# Patient Record
Sex: Female | Born: 1960 | Race: White | Hispanic: No | Marital: Single | Smoking: Former smoker
Health system: Southern US, Community
[De-identification: ages and names within clinical notes are randomized; demographics above are authoritative.]

## PROBLEM LIST (undated history)

## (undated) DIAGNOSIS — K5730 Diverticulosis of large intestine without perforation or abscess without bleeding: Secondary | ICD-10-CM

## (undated) HISTORY — PX: THYROIDECTOMY: SHX17

## (undated) HISTORY — PX: ABDOMINAL HYSTERECTOMY: SHX81

## (undated) HISTORY — DX: Diverticulosis of large intestine without perforation or abscess without bleeding: K57.30

---

## 2000-03-12 ENCOUNTER — Encounter: Payer: Self-pay | Admitting: Internal Medicine

## 2000-03-12 ENCOUNTER — Other Ambulatory Visit: Admission: RE | Admit: 2000-03-12 | Discharge: 2000-03-12 | Payer: Self-pay | Admitting: Family Medicine

## 2000-03-12 ENCOUNTER — Encounter: Admission: RE | Admit: 2000-03-12 | Discharge: 2000-03-12 | Payer: Self-pay | Admitting: Internal Medicine

## 2003-02-01 ENCOUNTER — Encounter (HOSPITAL_COMMUNITY): Admission: RE | Admit: 2003-02-01 | Discharge: 2003-05-02 | Payer: Self-pay | Admitting: Endocrinology

## 2003-02-02 ENCOUNTER — Encounter: Payer: Self-pay | Admitting: Endocrinology

## 2003-02-04 DIAGNOSIS — E0500 Thyrotoxicosis with diffuse goiter without thyrotoxic crisis or storm: Secondary | ICD-10-CM

## 2003-07-26 ENCOUNTER — Encounter (HOSPITAL_COMMUNITY): Admission: RE | Admit: 2003-07-26 | Discharge: 2003-10-24 | Payer: Self-pay | Admitting: Endocrinology

## 2004-07-06 ENCOUNTER — Ambulatory Visit: Payer: Self-pay | Admitting: Family Medicine

## 2004-08-15 ENCOUNTER — Ambulatory Visit: Payer: Self-pay | Admitting: Family Medicine

## 2004-08-17 ENCOUNTER — Ambulatory Visit: Payer: Self-pay | Admitting: Family Medicine

## 2004-08-24 ENCOUNTER — Ambulatory Visit: Payer: Self-pay | Admitting: Family Medicine

## 2004-08-24 ENCOUNTER — Encounter: Admission: RE | Admit: 2004-08-24 | Discharge: 2004-08-24 | Payer: Self-pay | Admitting: Family Medicine

## 2004-08-31 ENCOUNTER — Ambulatory Visit: Payer: Self-pay | Admitting: Internal Medicine

## 2004-09-01 ENCOUNTER — Ambulatory Visit: Payer: Self-pay | Admitting: Internal Medicine

## 2004-09-06 DIAGNOSIS — K5730 Diverticulosis of large intestine without perforation or abscess without bleeding: Secondary | ICD-10-CM

## 2004-09-06 DIAGNOSIS — K589 Irritable bowel syndrome without diarrhea: Secondary | ICD-10-CM

## 2004-09-06 DIAGNOSIS — K648 Other hemorrhoids: Secondary | ICD-10-CM | POA: Insufficient documentation

## 2004-09-06 HISTORY — DX: Diverticulosis of large intestine without perforation or abscess without bleeding: K57.30

## 2004-11-07 ENCOUNTER — Ambulatory Visit: Payer: Self-pay | Admitting: Internal Medicine

## 2004-12-25 ENCOUNTER — Ambulatory Visit: Payer: Self-pay | Admitting: Family Medicine

## 2005-01-15 ENCOUNTER — Ambulatory Visit: Payer: Self-pay | Admitting: Family Medicine

## 2005-06-06 ENCOUNTER — Encounter (INDEPENDENT_AMBULATORY_CARE_PROVIDER_SITE_OTHER): Payer: Self-pay | Admitting: Internal Medicine

## 2005-06-11 ENCOUNTER — Ambulatory Visit: Payer: Self-pay | Admitting: Family Medicine

## 2005-06-11 ENCOUNTER — Other Ambulatory Visit: Admission: RE | Admit: 2005-06-11 | Discharge: 2005-06-11 | Payer: Self-pay | Admitting: Family Medicine

## 2005-06-14 ENCOUNTER — Ambulatory Visit: Payer: Self-pay | Admitting: Family Medicine

## 2005-07-10 ENCOUNTER — Ambulatory Visit: Payer: Self-pay | Admitting: Family Medicine

## 2005-09-10 ENCOUNTER — Ambulatory Visit: Payer: Self-pay | Admitting: Family Medicine

## 2005-11-14 ENCOUNTER — Ambulatory Visit: Payer: Self-pay | Admitting: Family Medicine

## 2005-12-10 ENCOUNTER — Ambulatory Visit: Payer: Self-pay | Admitting: Family Medicine

## 2006-01-16 ENCOUNTER — Ambulatory Visit: Payer: Self-pay | Admitting: Family Medicine

## 2006-01-30 ENCOUNTER — Ambulatory Visit: Payer: Self-pay | Admitting: Family Medicine

## 2006-03-05 ENCOUNTER — Encounter (INDEPENDENT_AMBULATORY_CARE_PROVIDER_SITE_OTHER): Payer: Self-pay | Admitting: Internal Medicine

## 2006-03-28 ENCOUNTER — Ambulatory Visit: Payer: Self-pay | Admitting: Family Medicine

## 2006-07-17 ENCOUNTER — Ambulatory Visit: Payer: Self-pay | Admitting: Family Medicine

## 2006-07-19 ENCOUNTER — Ambulatory Visit: Payer: Self-pay | Admitting: Family Medicine

## 2006-07-25 ENCOUNTER — Encounter
Admission: RE | Admit: 2006-07-25 | Discharge: 2006-10-23 | Payer: Self-pay | Admitting: Physical Medicine and Rehabilitation

## 2006-10-01 ENCOUNTER — Ambulatory Visit: Payer: Self-pay | Admitting: Family Medicine

## 2006-10-01 LAB — CONVERTED CEMR LAB
Cholesterol: 228 mg/dL (ref 0–200)
Direct LDL: 160.8 mg/dL
HDL: 56.6 mg/dL (ref >39.0)
TSH: 1.45 microintl units/mL (ref 0.35–5.50)
Total CHOL/HDL Ratio: 4
Triglycerides: 100 mg/dL (ref 0–149)
VLDL: 20 mg/dL (ref 0–40)

## 2007-01-02 ENCOUNTER — Ambulatory Visit: Payer: Self-pay | Admitting: Family Medicine

## 2007-01-02 DIAGNOSIS — M531 Cervicobrachial syndrome: Secondary | ICD-10-CM

## 2007-01-02 DIAGNOSIS — E039 Hypothyroidism, unspecified: Secondary | ICD-10-CM

## 2007-01-16 ENCOUNTER — Encounter (INDEPENDENT_AMBULATORY_CARE_PROVIDER_SITE_OTHER): Payer: Self-pay | Admitting: Internal Medicine

## 2007-01-21 ENCOUNTER — Telehealth (INDEPENDENT_AMBULATORY_CARE_PROVIDER_SITE_OTHER): Payer: Self-pay | Admitting: Internal Medicine

## 2007-02-17 ENCOUNTER — Ambulatory Visit: Payer: Self-pay | Admitting: Family Medicine

## 2007-03-04 ENCOUNTER — Telehealth (INDEPENDENT_AMBULATORY_CARE_PROVIDER_SITE_OTHER): Payer: Self-pay | Admitting: Internal Medicine

## 2007-04-18 ENCOUNTER — Telehealth (INDEPENDENT_AMBULATORY_CARE_PROVIDER_SITE_OTHER): Payer: Self-pay | Admitting: *Deleted

## 2007-05-15 ENCOUNTER — Encounter (INDEPENDENT_AMBULATORY_CARE_PROVIDER_SITE_OTHER): Payer: Self-pay | Admitting: Internal Medicine

## 2007-06-10 ENCOUNTER — Telehealth (INDEPENDENT_AMBULATORY_CARE_PROVIDER_SITE_OTHER): Payer: Self-pay | Admitting: Internal Medicine

## 2007-06-20 ENCOUNTER — Ambulatory Visit: Payer: Self-pay | Admitting: Family Medicine

## 2007-06-27 ENCOUNTER — Encounter (INDEPENDENT_AMBULATORY_CARE_PROVIDER_SITE_OTHER): Payer: Self-pay | Admitting: Internal Medicine

## 2007-07-08 ENCOUNTER — Telehealth (INDEPENDENT_AMBULATORY_CARE_PROVIDER_SITE_OTHER): Payer: Self-pay | Admitting: Internal Medicine

## 2007-07-28 ENCOUNTER — Ambulatory Visit: Payer: Self-pay | Admitting: Family Medicine

## 2007-07-28 DIAGNOSIS — J069 Acute upper respiratory infection, unspecified: Secondary | ICD-10-CM | POA: Insufficient documentation

## 2007-08-08 ENCOUNTER — Encounter (INDEPENDENT_AMBULATORY_CARE_PROVIDER_SITE_OTHER): Payer: Self-pay | Admitting: Internal Medicine

## 2007-10-09 ENCOUNTER — Telehealth (INDEPENDENT_AMBULATORY_CARE_PROVIDER_SITE_OTHER): Payer: Self-pay | Admitting: Internal Medicine

## 2007-10-14 ENCOUNTER — Ambulatory Visit: Payer: Self-pay | Admitting: Family Medicine

## 2007-10-24 ENCOUNTER — Encounter: Payer: Self-pay | Admitting: Internal Medicine

## 2007-10-27 ENCOUNTER — Ambulatory Visit: Payer: Self-pay | Admitting: Family Medicine

## 2007-10-28 LAB — CONVERTED CEMR LAB
AST: 25 units/L (ref 0–37)
Basophils Relative: 0.9 % (ref 0.0–1.0)
Bilirubin, Direct: 0.1 mg/dL (ref 0.0–0.3)
CO2: 30 meq/L (ref 19–32)
Chloride: 105 meq/L (ref 96–112)
Direct LDL: 168.5 mg/dL
Eosinophils Absolute: 0.1 10*3/uL (ref 0.0–0.6)
Eosinophils Relative: 2.1 % (ref 0.0–5.0)
GFR calc non Af Amer: 96 mL/min
Glucose, Bld: 103 mg/dL — ABNORMAL HIGH (ref 70–99)
HCT: 38 % (ref 36.0–46.0)
Lymphocytes Relative: 42 % (ref 12.0–46.0)
MCV: 91.1 fL (ref 78.0–100.0)
Neutro Abs: 2 10*3/uL (ref 1.4–7.7)
Neutrophils Relative %: 45.2 % (ref 43.0–77.0)
RBC: 4.17 M/uL (ref 3.87–5.11)
Sodium: 142 meq/L (ref 135–145)
TSH: 1.73 microintl units/mL (ref 0.35–5.50)
Total Protein: 7.2 g/dL (ref 6.0–8.3)
WBC: 4.3 10*3/uL — ABNORMAL LOW (ref 4.5–10.5)

## 2007-11-10 ENCOUNTER — Encounter: Payer: Self-pay | Admitting: Family Medicine

## 2007-11-13 ENCOUNTER — Encounter (INDEPENDENT_AMBULATORY_CARE_PROVIDER_SITE_OTHER): Payer: Self-pay | Admitting: Internal Medicine

## 2008-04-27 ENCOUNTER — Ambulatory Visit: Payer: Self-pay | Admitting: Family Medicine

## 2008-04-27 DIAGNOSIS — E7800 Pure hypercholesterolemia, unspecified: Secondary | ICD-10-CM | POA: Insufficient documentation

## 2008-04-27 DIAGNOSIS — R61 Generalized hyperhidrosis: Secondary | ICD-10-CM

## 2008-04-28 ENCOUNTER — Telehealth (INDEPENDENT_AMBULATORY_CARE_PROVIDER_SITE_OTHER): Payer: Self-pay | Admitting: Internal Medicine

## 2008-04-28 LAB — CONVERTED CEMR LAB
BUN: 9 mg/dL (ref 6–23)
Basophils Relative: 0.3 % (ref 0.0–3.0)
Creatinine, Ser: 0.8 mg/dL (ref 0.4–1.2)
Direct LDL: 189.5 mg/dL
Eosinophils Absolute: 0.1 10*3/uL (ref 0.0–0.7)
Eosinophils Relative: 1.6 % (ref 0.0–5.0)
GFR calc Af Amer: 99 mL/min
GFR calc non Af Amer: 82 mL/min
Glucose, Bld: 116 mg/dL — ABNORMAL HIGH (ref 70–99)
HCT: 38.8 % (ref 36.0–46.0)
Hemoglobin: 13.7 g/dL (ref 12.0–15.0)
MCV: 89.7 fL (ref 78.0–100.0)
Monocytes Absolute: 0.4 10*3/uL (ref 0.1–1.0)
Monocytes Relative: 8.9 % (ref 3.0–12.0)
Neutro Abs: 2.7 10*3/uL (ref 1.4–7.7)
Platelets: 214 10*3/uL (ref 150–400)
RBC: 4.32 M/uL (ref 3.87–5.11)
Total CHOL/HDL Ratio: 3.1
Triglycerides: 45 mg/dL (ref 0–149)
WBC: 5 10*3/uL (ref 4.5–10.5)

## 2008-05-11 ENCOUNTER — Encounter (INDEPENDENT_AMBULATORY_CARE_PROVIDER_SITE_OTHER): Payer: Self-pay | Admitting: Internal Medicine

## 2008-05-11 ENCOUNTER — Ambulatory Visit: Payer: Self-pay | Admitting: Family Medicine

## 2008-05-11 ENCOUNTER — Other Ambulatory Visit: Admission: RE | Admit: 2008-05-11 | Discharge: 2008-05-11 | Payer: Self-pay | Admitting: Family Medicine

## 2008-05-11 LAB — CONVERTED CEMR LAB: Pap Smear: NORMAL

## 2008-05-18 ENCOUNTER — Encounter (INDEPENDENT_AMBULATORY_CARE_PROVIDER_SITE_OTHER): Payer: Self-pay | Admitting: *Deleted

## 2008-05-18 ENCOUNTER — Encounter (INDEPENDENT_AMBULATORY_CARE_PROVIDER_SITE_OTHER): Payer: Self-pay | Admitting: Internal Medicine

## 2008-07-19 ENCOUNTER — Encounter (INDEPENDENT_AMBULATORY_CARE_PROVIDER_SITE_OTHER): Payer: Self-pay | Admitting: *Deleted

## 2008-10-29 ENCOUNTER — Ambulatory Visit: Payer: Self-pay | Admitting: Family Medicine

## 2008-11-25 ENCOUNTER — Ambulatory Visit: Payer: Self-pay | Admitting: Family Medicine

## 2008-11-25 DIAGNOSIS — H6990 Unspecified Eustachian tube disorder, unspecified ear: Secondary | ICD-10-CM | POA: Insufficient documentation

## 2008-11-25 DIAGNOSIS — H6980 Other specified disorders of Eustachian tube, unspecified ear: Secondary | ICD-10-CM

## 2009-10-31 ENCOUNTER — Other Ambulatory Visit: Admission: RE | Admit: 2009-10-31 | Discharge: 2009-10-31 | Payer: Self-pay | Admitting: Family Medicine

## 2009-10-31 ENCOUNTER — Ambulatory Visit: Payer: Self-pay | Admitting: Family Medicine

## 2009-11-01 ENCOUNTER — Encounter (INDEPENDENT_AMBULATORY_CARE_PROVIDER_SITE_OTHER): Payer: Self-pay | Admitting: *Deleted

## 2009-11-01 ENCOUNTER — Telehealth: Payer: Self-pay | Admitting: Family Medicine

## 2009-11-01 LAB — CONVERTED CEMR LAB
AST: 19 units/L (ref 0–37)
Albumin: 4 g/dL (ref 3.5–5.2)
Alkaline Phosphatase: 55 units/L (ref 39–117)
Cholesterol: 293 mg/dL — ABNORMAL HIGH (ref 0–200)
Direct LDL: 187 mg/dL
Free T4: 0.6 ng/dL (ref 0.6–1.6)
TSH: 24.72 microintl units/mL — ABNORMAL HIGH (ref 0.35–5.50)
Total CHOL/HDL Ratio: 3
Total Protein: 7.9 g/dL (ref 6.0–8.3)
Triglycerides: 167 mg/dL — ABNORMAL HIGH (ref 0.0–149.0)
VLDL: 33.4 mg/dL (ref 0.0–40.0)

## 2009-11-02 ENCOUNTER — Encounter (INDEPENDENT_AMBULATORY_CARE_PROVIDER_SITE_OTHER): Payer: Self-pay | Admitting: *Deleted

## 2009-11-21 ENCOUNTER — Telehealth: Payer: Self-pay | Admitting: Family Medicine

## 2009-12-02 ENCOUNTER — Encounter: Payer: Self-pay | Admitting: Family Medicine

## 2009-12-26 ENCOUNTER — Ambulatory Visit: Payer: Self-pay | Admitting: Family Medicine

## 2009-12-27 LAB — CONVERTED CEMR LAB
ALT: 24 U/L
AST: 22 U/L
Albumin: 3.9 g/dL
Alkaline Phosphatase: 55 U/L
BUN: 11 mg/dL
Bilirubin, Direct: 0 mg/dL
CO2: 31 meq/L
Calcium: 9.1 mg/dL
Chloride: 102 meq/L
Cholesterol: 292 mg/dL — ABNORMAL HIGH
Creatinine, Ser: 0.8 mg/dL
Direct LDL: 185.8 mg/dL
Free T4: 0.8 ng/dL
GFR calc non Af Amer: 82.23 mL/min
Glucose, Bld: 105 mg/dL — ABNORMAL HIGH
HDL: 107 mg/dL
Potassium: 4.4 meq/L
Sodium: 139 meq/L
T3, Free: 2.3 pg/mL
TSH: 5.61 u[IU]/mL — ABNORMAL HIGH
Total Bilirubin: 0.6 mg/dL
Total CHOL/HDL Ratio: 3
Total Protein: 7.1 g/dL
Triglycerides: 111 mg/dL
VLDL: 22.2 mg/dL

## 2010-02-21 ENCOUNTER — Encounter (INDEPENDENT_AMBULATORY_CARE_PROVIDER_SITE_OTHER): Payer: Self-pay | Admitting: *Deleted

## 2010-03-24 ENCOUNTER — Ambulatory Visit: Payer: Self-pay | Admitting: Family Medicine

## 2010-03-27 ENCOUNTER — Ambulatory Visit: Payer: Self-pay | Admitting: Family Medicine

## 2010-03-27 DIAGNOSIS — M9901 Segmental and somatic dysfunction of cervical region: Secondary | ICD-10-CM

## 2010-03-27 LAB — CONVERTED CEMR LAB
ALT: 20 units/L (ref 0–35)
Albumin: 4.1 g/dL (ref 3.5–5.2)
Cholesterol: 257 mg/dL — ABNORMAL HIGH (ref 0–200)
Direct LDL: 141.2 mg/dL
HDL: 97.8 mg/dL (ref >39.00)
TSH: 4.68 microintl units/mL (ref 0.35–5.50)
Total Protein: 7.3 g/dL (ref 6.0–8.3)
Triglycerides: 100 mg/dL (ref 0.0–149.0)
VLDL: 20 mg/dL (ref 0.0–40.0)

## 2010-03-29 ENCOUNTER — Encounter: Admission: RE | Admit: 2010-03-29 | Discharge: 2010-03-29 | Payer: Self-pay | Admitting: Family Medicine

## 2010-03-29 ENCOUNTER — Telehealth: Payer: Self-pay | Admitting: Family Medicine

## 2010-09-05 NOTE — Miscellaneous (Signed)
Summary: med list update- increase in niaspan dosage  Clinical Lists Changes  Medications: Changed medication from NIASPAN 500 MG TBCR (NIACIN (ANTIHYPERLIPIDEMIC)) 1 tab by mouth at bedtime (take with aspirin 81 mg daily to reduce flushing). to NIASPAN 500 MG TBCR (NIACIN (ANTIHYPERLIPIDEMIC)) 2 tabs by mouth at bedtime (take with aspirin 81 mg daily to reduce flushing).     Prior Medications: NORTRIPTYLINE HCL 25 MG  CAPS (NORTRIPTYLINE HCL) Take 3 by mouth at bedtime PREMARIN 0.9 MG TABS (ESTROGENS CONJUGATED) 1 once daily for HRT PERCOCET 10-325 MG TABS (OXYCODONE-ACETAMINOPHEN) Take one by mouth as directed SKELAXIN 800 MG TABS (METAXALONE) 900 mg. two times a day to three times a day  and as needed . VALIUM 5 MG TABS (DIAZEPAM) take one tablet by mouth at at bedtime NIASPAN 500 MG TBCR (NIACIN (ANTIHYPERLIPIDEMIC)) 2 tabs by mouth at bedtime (take with aspirin 81 mg daily to reduce flushing). SYNTHROID 125 MCG TABS (LEVOTHYROXINE SODIUM) 1 tab by mouth daily. Current Allergies: No known allergies

## 2010-09-05 NOTE — Letter (Signed)
Summary: Generic Letter  [facility]  [address] [place], [place] [address]   Phone: [phone]  Fax: [phone]    11/01/2009    DAE ANTONUCCI [address] [place], [place]  [address]    Dear Ms. Fairhurst,  Enclosed please find the copy of your recent labwork.  I have highlighted your values and to the right of that is your normal range levels.  Hope this is helpful to you.  Your prescriptions have been sent to your pharmacy.  We need to recheck your blood in 3 months.  That appointment is set for February 01, 2010 at 8:10 a.m. (fasting).  This is for labwork only.  If this appointment is not convenient, please call the office to reschedule.  (Appointment card enclosed).  Sincerely,   Lugene Fuquay CMA (AAMA)

## 2010-09-05 NOTE — Assessment & Plan Note (Signed)
Summary: 1 MTH FU/CLE   Vital Signs:  Patient profile:   50 year old female Height:      65 inches Weight:      174.38 pounds BMI:     29.12 Temp:     97.9 degrees F oral Pulse rate:   80 / minute Pulse rhythm:   regular BP sitting:   102 / 70  (left arm) Cuff size:   regular  Vitals Entered By: Linde Gillis CMA Duncan Dull) (Dec 26, 2009 7:59 AM) CC: 1 month follow up   History of Present Illness: 50 yo female here for follow up hyperlipidemia and hypothyroidism.   HLD-  has had  elevated lipid panel in past, then we checked them in 10/2009 and LDL was 187, TG 167, HDL 93.  Started her on statin which worsened her muscle aches.  Then started Niaspan 500 mg qhs.  She is tolerating it ok.  Trying to improved diet as well.  Graves disease- we increased her Synthroid  to 112 micrograms daily in March, TSH was 24.72.Marland Kitchen   Feels better since she increased dose, less tired, less "off."  Current Medications (verified): 1)  Nortriptyline Hcl 25 Mg  Caps (Nortriptyline Hcl) .... Take 3 By Mouth At Bedtime 2)  Premarin 0.9 Mg Tabs (Estrogens Conjugated) .Marland Kitchen.. 1 Once Daily For Hrt 3)  Percocet 10-325 Mg Tabs (Oxycodone-Acetaminophen) .... Take One By Mouth As Directed 4)  Skelaxin 800 Mg Tabs (Metaxalone) .... 900 Mg. Two Times A Day To Three Times A Day  and As Needed . 5)  Valium 5 Mg Tabs (Diazepam) .... Take One Tablet By Mouth At At Bedtime 6)  Synthroid 112 Mcg Tabs (Levothyroxine Sodium) .Marland Kitchen.. 1 Tab By Mouth Daily. 7)  Niaspan 500 Mg Tbcr (Niacin (Antihyperlipidemic)) .Marland Kitchen.. 1 Tab By Mouth At Bedtime (Take With Aspirin 81 Mg Daily To Reduce Flushing).  Allergies (verified): No Known Drug Allergies  Review of Systems      See HPI General:  Denies malaise. Eyes:  Denies blurring. ENT:  Denies difficulty swallowing. CV:  Denies chest pain or discomfort and difficulty breathing at night. Resp:  Denies shortness of breath. GI:  Denies abdominal pain and change in bowel habits.  Physical  Exam  General:  alert, well-developed, well-nourished, and well-hydrated.  NAD Mouth:  MMM Lungs:  Normal respiratory effort, chest expands symmetrically. Lungs are clear to auscultation, no crackles or wheezes. Heart:  normal rate, regular rhythm, and no murmur.   Abdomen:  Bowel sounds positive,abdomen soft and non-tender without masses, organomegaly or hernias noted. Extremities:  No clubbing, cyanosis, edema, or deformity noted with normal full range of motion of all joints.   Psych:  Cognition and judgment appear intact. Alert and cooperative with normal attention span and concentration. No apparent delusions, illusions, hallucinations   Impression & Recommendations:  Problem # 1:  HRT (ICD-V07.4) Assessment Unchanged Tolerating Niaspan ok.  thyroid dysregulation may have also been playing a roll. Recheck FLP and hepatic panel today. If remains elevated, increase Niaspan to 1 gram at bedtime.   Problem # 2:  GRAVES DISEASE (ICD-242.00) Assessment: Deteriorated recheck TSH today.  May still need to increase dose. Orders: Venipuncture (62376) TLB-BMP (Basic Metabolic Panel-BMET) (80048-METABOL) TLB-TSH (Thyroid Stimulating Hormone) (84443-TSH) TLB-T4 (Thyrox), Free [phone]) TLB-T3, Free (Triiodothyronine) (84481-T3FREE)  Complete Medication List: 1)  Nortriptyline Hcl 25 Mg Caps (Nortriptyline hcl) .... Take 3 by mouth at bedtime 2)  Premarin 0.9 Mg Tabs (Estrogens conjugated) .Marland Kitchen.. 1 once daily for hrt 3)  Percocet 10-325 Mg Tabs (Oxycodone-acetaminophen) .... Take one by mouth as directed 4)  Skelaxin 800 Mg Tabs (Metaxalone) .... 900 mg. two times a day to three times a day  and as needed . 5)  Valium 5 Mg Tabs (Diazepam) .... Take one tablet by mouth at at bedtime 6)  Synthroid 112 Mcg Tabs (Levothyroxine sodium) .Marland Kitchen.. 1 tab by mouth daily. 7)  Niaspan 500 Mg Tbcr (Niacin (antihyperlipidemic)) .Marland Kitchen.. 1 tab by mouth at bedtime (take with aspirin 81 mg daily to reduce  flushing).  Other Orders: TLB-Lipid Panel (80061-LIPID) TLB-Hepatic/Liver Function Pnl (80076-HEPATIC) Prescriptions: NIASPAN 500 MG TBCR (NIACIN (ANTIHYPERLIPIDEMIC)) 1 tab by mouth at bedtime (take with aspirin 81 mg daily to reduce flushing).  #90 x 3   Entered and Authorized by:   Ruthe Mannan MD   Signed by:   Ruthe Mannan MD on 12/26/2009   Method used:   Electronically to        [facility]* (retail)       [address]       [place], [place]  [address]       Ph: [phone]       Fax: [phone]   RxID:   [id number]   Current Allergies (reviewed today): No known allergies

## 2010-09-05 NOTE — Progress Notes (Signed)
Summary: please sign synthroid script  Phone Note From Pharmacy   Caller: [facility]* Call For: Dr. Dayton Martes  Summary of Call: Please sign synthroid script as dispense as written.  Copy of script is on your desk. Initial call taken by: Lowella Petties CMA,  November 01, 2009 9:45 AM  Follow-up for Phone Call        ON my desk Follow-up by: Ruthe Mannan MD,  November 01, 2009 9:57 AM  Additional Follow-up for Phone Call Additional follow up Details #1::        Script faxed back to [facility]. Additional Follow-up by: Lowella Petties CMA,  November 01, 2009 10:15 AM

## 2010-09-05 NOTE — Letter (Signed)
Summary: Results Follow up Letter  [facility]  [address] [place], [place] [address]   Phone: [phone]  Fax: [phone]    11/02/2009 MRN: [id number]    [facility] [address] [place], [place]  [address]    Dear Ms. Zertuche,  The following are the results of your recent test(s):  Test         Result    Pap Smear:        Normal __X___  Not Normal _____ Comments: ______________________________________________________ Cholesterol: LDL(Bad cholesterol):         Your goal is less than:         HDL (Good cholesterol):       Your goal is more than: Comments:  ______________________________________________________ Mammogram:        Normal _____  Not Normal _____ Comments:  ___________________________________________________________________ Hemoccult:        Normal _____  Not normal _______ Comments:    _____________________________________________________________________ Other Tests:    We routinely do not discuss normal results over the telephone.  If you desire a copy of the results, or you have any questions about this information we can discuss them at your next office visit.   Sincerely,    Ruthe Mannan,  M.D.  TA:lsf

## 2010-09-05 NOTE — Progress Notes (Signed)
Summary: regarding joint pain  Phone Note Call from Patient Call back at Home Phone [phone]   Caller: Patient Call For: Dr. Dayton Martes Summary of Call: Pt has a hx of muscle and joint pain which has gotten worse in the last few days.  She wonders if the lipitor could be causing it to get worse.  She has been taking this for about 2-3 weeks.  She has pain in almost all joints- worse in knees, hand, elbows.  Please advise on whether she should continue the lipitor. Initial call taken by: Lowella Petties CMA,  November 21, 2009 8:51 AM  Follow-up for Phone Call        Lipitor typically causes muscle, not joint pain.  If she is concerned that it is causing muscle pain, we can stop the lipitor and try something else. Follow-up by: Ruthe Mannan MD,  November 21, 2009 9:01 AM  Additional Follow-up for Phone Call Additional follow up Details #1::        Patient notified as instructed by telephone. Pt said her muscles are hurting also so would like to try something different instead of Lipitor. Pt uses [facility].Lewanda Rife LPN  November 21, 2009 9:15 AM     Additional Follow-up for Phone Call Additional follow up Details #2::    Ok, lipitor removed from list.  Called in Niaspan.  Come see me in one month. Follow-up by: Ruthe Mannan MD,  November 21, 2009 9:39 AM  Additional Follow-up for Phone Call Additional follow up Details #3:: Details for Additional Follow-up Action Taken: Patient notified as instructed by telephone. Appt scheduled with Dr Dayton Martes for 12/21/09 at 8:15 and pt will come fasting if lab work needed.Lewanda Rife LPN  November 21, 2009 9:45 AM   New/Updated Medications: NIASPAN 500 MG TBCR (NIACIN (ANTIHYPERLIPIDEMIC)) 1 tab by mouth at bedtime (take with aspirin 81 mg daily to reduce flushing). Prescriptions: NIASPAN 500 MG TBCR (NIACIN (ANTIHYPERLIPIDEMIC)) 1 tab by mouth at bedtime (take with aspirin 81 mg daily to reduce flushing).  #30 x 0   Entered and Authorized by:   Ruthe Mannan MD  Signed by:   Ruthe Mannan MD on 11/21/2009   Method used:   Electronically to        [facility]* (retail)       [address]       [place], [place]  [address]       Ph: [phone]       Fax: [phone]   RxID:   [id number]

## 2010-09-05 NOTE — Assessment & Plan Note (Signed)
Summary: CPX-XFER FROM [facility]  [facility]   Vital Signs:  Patient profile:   50 year old female Height:      65 inches Weight:      174.38 pounds BMI:     29.12 Temp:     98.3 degrees F oral Pulse rate:   84 / minute Pulse rhythm:   regular BP sitting:   112 / 76  (left arm) Cuff size:   regular  Vitals Entered By: Delilah Shan CMA Duncan Dull) (October 31, 2009 9:44 AM) CC: CPX - Transfer from [facility]   History of Present Illness: 50 yo female new to me here for CPX/Pap.  Well woman- s/p hysterectomy over 7 years ago for endometriosis. Has never had an abnormal pap smear. On HRT for hot flashes, feels it is working well. No issues with vaginal dryness or other menopausal symptoms.  HLD-  had elevated lipid panel in 9/09, never had it rechecked-LDL 189!, HDL 95, TG 45. She has tried to limit saturated fats since that time.  Graves disease-- on Synthroid 100 micrograms daily.  Has not had thyroid levels checked in awhile.    Cervical dystonia--fairly stable, sees Dr. Pearson Grippe at [facility], botox injections every 10 weeks, helping.  Current Medications (verified): 1)  Synthroid 100 Mcg Tabs (Levothyroxine Sodium) .... Take 1 Tablet By Mouth Once A Day 2)  Nortriptyline Hcl 25 Mg  Caps (Nortriptyline Hcl) .... Take 3 By Mouth At Bedtime 3)  Premarin 0.9 Mg Tabs (Estrogens Conjugated) .Marland Kitchen.. 1 Once Daily For Hrt 4)  Percocet 10-325 Mg Tabs (Oxycodone-Acetaminophen) .... Take One By Mouth As Directed 5)  Skelaxin 800 Mg Tabs (Metaxalone) .... 900 Mg. Two Times A Day To Three Times A Day  and As Needed . 6)  Valium 2 Mg Tabs (Diazepam) .... Take 2 Tablets By Mouth At Bedtime and As Needed  Allergies (verified): No Known Drug Allergies  Review of Systems      See HPI General:  Denies fatigue, loss of appetite, malaise, and sweats. Eyes:  Denies blurring. ENT:  Denies difficulty swallowing. CV:  Denies chest pain or discomfort. Resp:  Denies shortness of breath. GU:  Denies  discharge, genital sores, hematuria, incontinence, urinary frequency, and urinary hesitancy. Derm:  Denies rash. Psych:  Denies anxiety and depression. Endo:  Denies cold intolerance, excessive hunger, excessive thirst, and heat intolerance.  Physical Exam  General:  alert, well-developed, well-nourished, and well-hydrated.  NAD Eyes:  pupils equal, pupils round, and no injection.   Ears:  R ear normal and L ear normal.   Nose:  External nasal examination shows no deformity or inflammation. Nasal mucosa are pink and moist without lesions or exudates. Mouth:  MMM Neck:  no masses, no thyromegaly, normal carotid upstroke, and no carotid bruits.   Breasts:  skin/areolae normal, no masses, no abnormal thickening, no nipple discharge, no tenderness, and no adenopathy.   Lungs:  moist cough, no crackles and no wheezes.   Heart:  normal rate, regular rhythm, and no murmur.   Abdomen:  soft, non-tender, normal bowel sounds, no distention, no masses, no guarding, no abdominal hernia, no inguinal hernia, no hepatomegaly, and no splenomegaly.   Genitalia:  Pelvic Exam:        External: normal female genitalia without lesions or masses        Vagina: normal without lesions or masses        Cervix: normal without lesions or masses        Adnexa:absent  Uterus: absent        Pap smear: performed Extremities:  no edema bilat lower legs Psych:  normally interactive and flat affect.     Impression & Recommendations:  Problem # 1:  WELL ADULT EXAM (ICD-V70.0) Reviewed preventive care protocols, scheduled due services, and updated immunizations Discussed nutrition, exercise, diet, and healthy lifestyle.  pap today. set up mammogram today. BMET today.  Problem # 2:  HYPERCHOLESTEROLEMIA (ICD-272.0) Assessment: Unchanged Need to recheck FLP today as prior had very elevated LDL, lost to follow up. No family h/o HLD, CAD, or MI. Likely needs to start a statin.  Discussed with pt today, she  is in agreement with plan. Orders: Venipuncture (09323) TLB-Lipid Panel (80061-LIPID) TLB-Hepatic/Liver Function Pnl (80076-HEPATIC)  Problem # 3:  GRAVES DISEASE (ICD-242.00) Assessment: Unchanged Recheck thyroid panel today. Orders: TLB-TSH (Thyroid Stimulating Hormone) (84443-TSH) TLB-T3, Free (Triiodothyronine) (84481-T3FREE) TLB-T4 (Thyrox), Free [phone])  Complete Medication List: 1)  Synthroid 100 Mcg Tabs (Levothyroxine sodium) .... Take 1 tablet by mouth once a day 2)  Nortriptyline Hcl 25 Mg Caps (Nortriptyline hcl) .... Take 3 by mouth at bedtime 3)  Premarin 0.9 Mg Tabs (Estrogens conjugated) .Marland Kitchen.. 1 once daily for hrt 4)  Percocet 10-325 Mg Tabs (Oxycodone-acetaminophen) .... Take one by mouth as directed 5)  Skelaxin 800 Mg Tabs (Metaxalone) .... 900 mg. two times a day to three times a day  and as needed . 6)  Valium 2 Mg Tabs (Diazepam) .... Take 2 tablets by mouth at bedtime and as needed  Other Orders: Pap Smear, Thin Prep ( Collection of) (K2706) Radiology Referral (Radiology)  Patient Instructions: 1)  Great to meet you, Ms. Ashley Royalty. 2)  Please stop by to see Shirlee Limerick on your way out. Prescriptions: PREMARIN 0.9 MG TABS (ESTROGENS CONJUGATED) 1 once daily for HRT  #30 x 6   Entered and Authorized by:   Ruthe Mannan MD   Signed by:   Ruthe Mannan MD on 10/31/2009   Method used:   Electronically to        [facility]* (retail)       [address]       [place], [place]  [address]       Ph: [phone]       Fax: [phone]   RxID:   [id number] SYNTHROID 100 MCG TABS (LEVOTHYROXINE SODIUM) Take 1 tablet by mouth once a day  #30 x 6   Entered and Authorized by:   Ruthe Mannan MD   Signed by:   Ruthe Mannan MD on 10/31/2009   Method used:   Electronically to        [facility]* (retail)       [address]       [place], [place]  [address]       Ph: [phone]       Fax: [phone]   RxID:   [id number]   Current Allergies (reviewed  today): No known allergies

## 2010-09-05 NOTE — Progress Notes (Signed)
Summary: regarding old tests  Phone Note Call from Patient Call back at Home Phone [phone]   Caller: Patient Call For: Ruthe Mannan MD Summary of Call: Pt is asking if you were able to locate past CT scans or x-rays, states that you were going to look for them.  She said she thinks these were put on disks and that we have them.  She is going for MRI this morning. Initial call taken by: Lowella Petties CMA,  March 29, 2010 8:45 AM  Follow-up for Phone Call        yes I read the report from 02/2006 so advise her to keep appointment with MRI and we will compare it to her previous one. Ruthe Mannan MD  March 29, 2010 8:49 AM   Advised pt, she is going for MRI now. Follow-up by: Lowella Petties CMA,  March 29, 2010 8:54 AM

## 2010-09-05 NOTE — Assessment & Plan Note (Signed)
Summary: F/U LABWORK/CLE   Vital Signs:  Patient profile:   50 year old female Height:      65 inches Weight:      179 pounds BMI:     29.89 Temp:     98.9 degrees F oral Pulse rate:   80 / minute Pulse rhythm:   regular BP sitting:   120 / 82  (right arm) Cuff size:   regular  Vitals Entered By: Linde Gillis CMA Duncan Dull) (March 27, 2010 7:57 AM) CC: follow up visit after lab work   History of Present Illness: 50 yo female here for follow up hyperlipidemia and hypothyroidism.   HLD-  has had  elevated lipid panel in past, then we checked them in 10/2009 and LDL was 187, TG 167, HDL 93.  Started her on statin which worsened her muscle aches.  Then started Niaspan which we have increased to 1000 mg qhs.  She is tolerating it ok.  Trying to improved diet as well. LDL is imporved at 141, HDL 97, TG 100.  Graves disease- we have slowly increased her synthroid to 125  micrograms daily.  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  Had been getting botox injections every 3 months but his office is out of stock, she has not had an injection since March and her symtpoms are getting much worse.  Some mornings, she cannot raise her left arm and has severe tingling.  Narcotics and benzos don't help much.  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  Current Medications (verified): 1)  Nortriptyline Hcl 25 Mg  Caps (Nortriptyline Hcl) .... Take 3 By Mouth At Bedtime 2)  Premarin 0.9 Mg Tabs (Estrogens Conjugated) .Marland Kitchen.. 1 Once Daily For Hrt 3)  Percocet 10-325 Mg Tabs (Oxycodone-Acetaminophen) .... Take One By Mouth As Directed 4)  Skelaxin 800 Mg Tabs (Metaxalone) .... 900 Mg. Two Times A Day To Three Times A Day  and As Needed . 5)  Valium 2 Mg Tabs (Diazepam) .... Take One Tablet By Mouth Daily 6)  Niaspan 500  Mg Tbcr (Niacin (Antihyperlipidemic)) .... 2 Tabs By Mouth At Bedtime (Take With Aspirin 81 Mg Daily To Reduce Flushing). 7)  Synthroid 125 Mcg Tabs (Levothyroxine Sodium) .Marland Kitchen.. 1 Tab By Mouth Daily.  Allergies (verified): No Known Drug Allergies  Past History:  Past Medical History: Last updated: 10/24/2007 Diverticulosis, colon (09/06/2004)  Past Surgical History: Last updated: 10/24/2007 TAH endometriosis (cervix left in) MRI C'spine== C 6-7 broad central & L) paracentral disk herniation, diffuse bulge C 5-6 --7/07 MRA cervical vessels-- neg-- 7/07 colonoscopy-- 2/06 repeat 10 yrs.  Social History: Last updated: 10/14/2007 Marital Status: single Children: 0 Occupation:   Risk Factors: Alcohol Use: 0 (10/29/2008) Caffeine Use: 3 (10/29/2008) Exercise: no (10/29/2008)  Risk Factors: Smoking Status: quit (10/29/2008) Packs/Day: 1/d (10/29/2008) Passive Smoke Exposure: no (10/14/2007)  Review of Systems      See HPI General:  Denies malaise. Eyes:  Denies blurring. CV:  Denies chest pain or discomfort. MS:  Complains of joint pain, loss of strength, and muscle aches; denies joint redness and joint swelling.  Physical Exam  General:  alert, well-developed, well-nourished, and well-hydrated.  NAD Mouth:  MMM Lungs:  Normal respiratory effort, chest expands symmetrically. Lungs are clear to auscultation, no crackles or wheezes.  Heart:  normal rate, regular rhythm, and no murmur.   Psych:  Cognition and judgment appear intact. Alert and cooperative with normal attention span and concentration. No apparent delusions, illusions, hallucinations   Impression & Recommendations:  Problem # 1:  CERVICAL SOMATIC DYSFUNCTION (ICD-739.1) Assessment Deteriorated Advised calling her neurologist again, but it does make sense to repeat her MRI since last once was in 2007.  Will place order today. Orders: Radiology Referral (Radiology)  Problem # 2:  HYPERCHOLESTEROLEMIA  (ICD-272.0) Assessment: Improved MUch improved but still not at goal.  Continue current dose and increased lifestyle adjustments.  Pt in agreement with plan. Her updated medication list for this problem includes:    Niaspan 500 Mg Tbcr (Niacin (antihyperlipidemic)) .Marland Kitchen... 2 tabs by mouth at bedtime (take with aspirin 81 mg daily to reduce flushing).  Problem # 3:  HYPOTHYROIDISM (ICD-244.9) Assessment: Improved TSH within normal limits now.  Continue current dose. Her updated medication list for this problem includes:    Synthroid 125 Mcg Tabs (Levothyroxine sodium) .Marland Kitchen... 1 tab by mouth daily.  Complete Medication List: 1)  Nortriptyline Hcl 25 Mg Caps (Nortriptyline hcl) .... Take 3 by mouth at bedtime 2)  Premarin 0.9 Mg Tabs (Estrogens conjugated) .Marland Kitchen.. 1 once daily for hrt 3)  Percocet 10-325 Mg Tabs (Oxycodone-acetaminophen) .... Take one by mouth as directed 4)  Skelaxin 800 Mg Tabs (Metaxalone) .... 900 mg. two times a day to three times a day  and as needed . 5)  Valium 2 Mg Tabs (Diazepam) .... Take one tablet by mouth daily 6)  Niaspan 500 Mg Tbcr (Niacin (antihyperlipidemic)) .... 2 tabs by mouth at bedtime (take with aspirin 81 mg daily to reduce flushing). 7)  Synthroid 125 Mcg Tabs (Levothyroxine sodium) .Marland Kitchen.. 1 tab by mouth daily.  Patient Instructions: 1)  Please stop by to see Shirlee Limerick on your way.  Current Allergies (reviewed today): No known allergies

## 2010-10-06 ENCOUNTER — Telehealth: Payer: Self-pay | Admitting: Family Medicine

## 2010-10-12 NOTE — Progress Notes (Signed)
Summary: meds sent to new pharmacy  Phone Note Call from Patient Call back at Home Phone [phone]   Caller: Mom Summary of Call: Pt states she has moved to [place], [place].  She will be using a new pharmacy-  [facility], phone [phone].  Refils on meds sent in.    Lowella Petties CMA, AAMA  October 06, 2010 2:35 PM     Prescriptions: SYNTHROID 125 MCG TABS (LEVOTHYROXINE SODIUM) 1 tab by mouth daily.  #30 x 6   Entered by:   Lowella Petties CMA, AAMA   Authorized by:   Ruthe Mannan MD   Signed by:   Lowella Petties CMA, AAMA on 10/06/2010   Method used:   Telephoned to ...         RxID:   [id number] NIASPAN 500 MG TBCR (NIACIN (ANTIHYPERLIPIDEMIC)) 2 tabs by mouth at bedtime (take with aspirin 81 mg daily to reduce flushing).  #60 x 6   Entered by:   Lowella Petties CMA, AAMA   Authorized by:   Ruthe Mannan MD   Signed by:   Lowella Petties CMA, AAMA on 10/06/2010   Method used:   Telephoned to ...         RxID:   [id number] PREMARIN 0.9 MG TABS (ESTROGENS CONJUGATED) 1 once daily for HRT  #30 x 6   Entered by:   Lowella Petties CMA, AAMA   Authorized by:   Ruthe Mannan MD   Signed by:   Lowella Petties CMA, AAMA on 10/06/2010   Method used:   Telephoned to ...         RxID:   [id number]

## 2010-12-25 ENCOUNTER — Ambulatory Visit: Payer: Self-pay | Admitting: Family Medicine

## 2011-02-22 ENCOUNTER — Ambulatory Visit: Payer: Self-pay | Admitting: Family Medicine

## 2011-02-28 ENCOUNTER — Ambulatory Visit (INDEPENDENT_AMBULATORY_CARE_PROVIDER_SITE_OTHER): Payer: Self-pay | Admitting: Family Medicine

## 2011-02-28 ENCOUNTER — Encounter: Payer: Self-pay | Admitting: Family Medicine

## 2011-02-28 ENCOUNTER — Other Ambulatory Visit: Payer: Self-pay | Admitting: *Deleted

## 2011-02-28 VITALS — BP 102/80 | HR 76 | Temp 98.3°F | Wt 173.5 lb

## 2011-02-28 DIAGNOSIS — E0500 Thyrotoxicosis with diffuse goiter without thyrotoxic crisis or storm: Secondary | ICD-10-CM

## 2011-02-28 DIAGNOSIS — M9981 Other biomechanical lesions of cervical region: Secondary | ICD-10-CM

## 2011-02-28 DIAGNOSIS — E7800 Pure hypercholesterolemia, unspecified: Secondary | ICD-10-CM

## 2011-02-28 DIAGNOSIS — E039 Hypothyroidism, unspecified: Secondary | ICD-10-CM

## 2011-02-28 DIAGNOSIS — K648 Other hemorrhoids: Secondary | ICD-10-CM

## 2011-02-28 LAB — BASIC METABOLIC PANEL
CO2: 31 mEq/L (ref 19–32)
Chloride: 100 mEq/L (ref 96–112)
Glucose, Bld: 111 mg/dL — ABNORMAL HIGH (ref 70–99)
Potassium: 4 mEq/L (ref 3.5–5.1)
Sodium: 140 mEq/L (ref 135–145)

## 2011-02-28 LAB — HEPATIC FUNCTION PANEL
Bilirubin, Direct: 0 mg/dL (ref 0.0–0.3)
Total Bilirubin: 0.3 mg/dL (ref 0.3–1.2)

## 2011-02-28 LAB — LIPID PANEL
HDL: 97.7 mg/dL (ref >39.00)
VLDL: 14.8 mg/dL (ref 0.0–40.0)

## 2011-02-28 LAB — LDL CHOLESTEROL, DIRECT: Direct LDL: 125.3 mg/dL

## 2011-02-28 MED ORDER — LEVOTHYROXINE SODIUM 125 MCG PO TABS: 125.0000 ug | ORAL_TABLET | Freq: Every day | ORAL | Status: DC

## 2011-02-28 MED ORDER — ESTROGENS CONJUGATED 0.9 MG PO TABS: 0.9000 mg | ORAL_TABLET | Freq: Every day | ORAL | Status: DC

## 2011-02-28 NOTE — Progress Notes (Signed)
50 yo female here for follow up hyperlipidemia and hypothyroidism.   HLD-  has had  elevated lipid panel in past, then we checked them in 10/2009 and LDL was 187, TG 167, HDL 93.  Started her on statin which worsened her muscle aches.  Then started Niaspan which we have increased to 1000 mg qhs.  She is tolerating it ok.  Trying to improved diet as well.  Graves disease- on  synthroid to 125  micrograms daily.  Denies any symptoms of hypo or hyperthyroidism. Lab Results  Component Value Date   TSH 4.68 03/24/2010     Patient Active Problem List  Diagnoses  . GRAVES DISEASE  . HYPOTHYROIDISM  . HYPERCHOLESTEROLEMIA  . EUSTACHIAN TUBE DYSFUNCTION  . INTERNAL HEMORRHOIDS  . URI  . DIVERTICULOSIS, COLON  . IRRITABLE BOWEL SYNDROME  . OCCIPITAL NEURALGIA  . CERVICAL SOMATIC DYSFUNCTION  . SWEATING   Past Medical History  Diagnosis Date  . Diverticulosis of colon 09/06/2004   Past Surgical History  Procedure Date  . Abdominal hysterectomy     cervix left in   History  Substance Use Topics  . Smoking status: Not on file  . Smokeless tobacco: Not on file  . Alcohol Use:    No family history on file. Allergies not on file No current outpatient prescriptions on file prior to visit.   The PMH, PSH, Social History, Family History, Medications, and allergies have been reviewed in [facility], and have been updated if relevant.  ROS: See HPI  Physical Exam BP 102/80  Pulse 76  Temp(Src) 98.3 F (36.8 C) (Oral)  Wt 173 lb 8 oz (78.699 kg)  General:  alert, well-developed, well-nourished, and well-hydrated.  NAD Mouth:  MMM Lungs:  Normal respiratory effort, chest expands symmetrically. Lungs are clear to auscultation, no crackles or wheezes. Heart:  normal rate, regular rhythm, and no murmur.   Psych:  Cognition and judgment appear intact. Alert and cooperative with normal attention span and concentration. No apparent delusions, illusions, hallucinations  Assessment and  Plan: 1. GRAVES DISEASE  Unchanged.  Recheck thyroid function labs today.   2. HYPERCHOLESTEROLEMIA     Continue currrent meds for now.  Recheck labs today, she is fasting. Orders Placed This Encounter  Procedures  . HM MAMMOGRAPHY  . HM PAP SMEAR  . TSH  . T4, free  . Basic Metabolic Panel (BMET)  . Lipid Profile  . Hepatic function panel  . HM COLONOSCOPY

## 2011-03-01 ENCOUNTER — Other Ambulatory Visit (INDEPENDENT_AMBULATORY_CARE_PROVIDER_SITE_OTHER): Payer: 59 | Admitting: Family Medicine

## 2011-03-01 ENCOUNTER — Other Ambulatory Visit: Payer: Self-pay | Admitting: Family Medicine

## 2011-03-01 DIAGNOSIS — R739 Hyperglycemia, unspecified: Secondary | ICD-10-CM

## 2011-03-01 DIAGNOSIS — R7309 Other abnormal glucose: Secondary | ICD-10-CM

## 2011-03-13 ENCOUNTER — Other Ambulatory Visit: Payer: Self-pay | Admitting: *Deleted

## 2011-03-13 MED ORDER — NIACIN ER (ANTIHYPERLIPIDEMIC) 500 MG PO TBCR: EXTENDED_RELEASE_TABLET | ORAL | Status: DC

## 2011-03-22 ENCOUNTER — Telehealth: Payer: Self-pay | Admitting: *Deleted

## 2011-03-22 DIAGNOSIS — Z1231 Encounter for screening mammogram for malignant neoplasm of breast: Secondary | ICD-10-CM

## 2011-03-22 NOTE — Telephone Encounter (Signed)
Patient is asking for an order for her mammogram be faxed to [facility]. She wants to schedule her own appt.

## 2011-03-22 NOTE — Telephone Encounter (Signed)
Shirlee Limerick printed the order and faxed it to [employer].  Patient was notified that order was faxed.

## 2011-03-22 NOTE — Telephone Encounter (Signed)
Order entered.  Is it possible to fax it?

## 2011-04-04 ENCOUNTER — Ambulatory Visit (INDEPENDENT_AMBULATORY_CARE_PROVIDER_SITE_OTHER): Payer: 59 | Admitting: Family Medicine

## 2011-04-04 ENCOUNTER — Other Ambulatory Visit: Payer: 59

## 2011-04-04 ENCOUNTER — Other Ambulatory Visit (HOSPITAL_COMMUNITY)
Admission: RE | Admit: 2011-04-04 | Discharge: 2011-04-04 | Disposition: A | Discharge status: Home / Self Care | Payer: 59 | Source: Ambulatory Visit | Attending: Family Medicine | Admitting: Family Medicine

## 2011-04-04 ENCOUNTER — Encounter: Payer: Self-pay | Admitting: Family Medicine

## 2011-04-04 VITALS — BP 118/80 | HR 86 | Temp 98.3°F | Ht 67.5 in | Wt 169.2 lb

## 2011-04-04 DIAGNOSIS — E039 Hypothyroidism, unspecified: Secondary | ICD-10-CM

## 2011-04-04 DIAGNOSIS — E7800 Pure hypercholesterolemia, unspecified: Secondary | ICD-10-CM

## 2011-04-04 DIAGNOSIS — R739 Hyperglycemia, unspecified: Secondary | ICD-10-CM

## 2011-04-04 DIAGNOSIS — E119 Type 2 diabetes mellitus without complications: Secondary | ICD-10-CM

## 2011-04-04 DIAGNOSIS — Z1211 Encounter for screening for malignant neoplasm of colon: Secondary | ICD-10-CM

## 2011-04-04 DIAGNOSIS — Z01419 Encounter for gynecological examination (general) (routine) without abnormal findings: Secondary | ICD-10-CM | POA: Insufficient documentation

## 2011-04-04 DIAGNOSIS — Z0000 Encounter for general adult medical examination without abnormal findings: Secondary | ICD-10-CM

## 2011-04-04 DIAGNOSIS — M531 Cervicobrachial syndrome: Secondary | ICD-10-CM

## 2011-04-04 DIAGNOSIS — Z1159 Encounter for screening for other viral diseases: Secondary | ICD-10-CM | POA: Insufficient documentation

## 2011-04-04 LAB — BASIC METABOLIC PANEL
BUN: 13 mg/dL (ref 6–23)
CO2: 29 mEq/L (ref 19–32)
Chloride: 104 mEq/L (ref 96–112)
Potassium: 4 mEq/L (ref 3.5–5.1)

## 2011-04-04 LAB — HEMOGLOBIN A1C: Hgb A1c MFr Bld: 6.4 % (ref 4.6–6.5)

## 2011-04-04 MED ORDER — LEVOTHYROXINE SODIUM 125 MCG PO TABS: 125.0000 ug | ORAL_TABLET | Freq: Every day | ORAL | Status: DC

## 2011-04-04 MED ORDER — NIACIN ER (ANTIHYPERLIPIDEMIC) 500 MG PO TBCR: EXTENDED_RELEASE_TABLET | ORAL | Status: DC

## 2011-04-04 NOTE — Progress Notes (Signed)
50 yo female here for CPX.  Mammogram appointment set up for later today. S/p hysterectomy 10 years ago for endometriosis (cervix intact). No h/o abnormal pap smears.  New onset DM- Lab Results  Component Value Date   HGBA1C 6.6* 03/01/2011  Has been working on her diet- cut out sugars/carbs.  Has already lost several pounds.  Wants to avoid DM medications if at all possible. Wt Readings from Last 3 Encounters:  04/04/11 169 lb 4 oz (76.771 kg)  02/28/11 173 lb 8 oz (78.699 kg)  03/27/10 179 lb (81.194 kg)    HLD-  has had  elevated lipid panel in past, then we checked them in 10/2009 and LDL was 187, TG 167, HDL 93.  Started her on statin which worsened her muscle aches.  Then started Niaspan which we have increased to 1000 mg qhs.  She is tolerating it ok.  Trying to improved diet as well. Lab Results  Component Value Date   CHOL 242* 02/28/2011   CHOL 257* 03/24/2010   CHOL 292* 12/26/2009   Lab Results  Component Value Date   HDL 97.70 02/28/2011   HDL 56.21 03/24/2010   HDL 308.65 12/26/2009   Lab Results  Component Value Date   TRIG 74.0 02/28/2011   TRIG 100.0 03/24/2010   TRIG 111.0 12/26/2009    Lab Results  Component Value Date   LDLDIRECT 125.3 02/28/2011   LDLDIRECT 141.2 03/24/2010   LDLDIRECT 185.8 12/26/2009    Graves disease- on  synthroid to 125  micrograms daily.  Denies any symptoms of hypo or hyperthyroidism. Lab Results  Component Value Date   TSH 3.27 02/28/2011     Patient Active Problem List  Diagnoses  . GRAVES DISEASE  . HYPOTHYROIDISM  . HYPERCHOLESTEROLEMIA  . EUSTACHIAN TUBE DYSFUNCTION  . INTERNAL HEMORRHOIDS  . URI  . DIVERTICULOSIS, COLON  . IRRITABLE BOWEL SYNDROME  . OCCIPITAL NEURALGIA  . CERVICAL SOMATIC DYSFUNCTION  . SWEATING  . Routine general medical examination at a health care facility   Past Medical History  Diagnosis Date  . Diverticulosis of colon 09/06/2004   Past Surgical History  Procedure Date  . Abdominal  hysterectomy     cervix left in   History  Substance Use Topics  . Smoking status: Never Smoker   . Smokeless tobacco: Not on file  . Alcohol Use: Not on file   No family history on file. No Known Allergies Current Outpatient Prescriptions on File Prior to Visit  Medication Sig Dispense Refill  . diazepam (VALIUM) 2 MG tablet Take 2 mg by mouth daily.        Marland Kitchen estrogens, conjugated, (PREMARIN) 0.9 MG tablet Take 1 tablet (0.9 mg total) by mouth daily. Take daily for 21 days then do not take for 7 days.  90 tablet  3  . levothyroxine (SYNTHROID, LEVOTHROID) 125 MCG tablet Take 1 tablet (125 mcg total) by mouth daily.  90 tablet  3  . metaxalone (SKELAXIN) 800 MG tablet Take 800 mg by mouth 3 (three) times daily.        . niacin (NIASPAN) 500 MG CR tablet Take two tablets by mouth at bedtime.  180 tablet  3  . nortriptyline (PAMELOR) 50 MG capsule Take 150mg  daily       . oxyCODONE-acetaminophen (PERCOCET) 10-325 MG per tablet Take 1 tablet by mouth every 4 (four) hours as needed.         The PMH, PSH, Social History, Family History, Medications, and allergies  have been reviewed in [facility], and have been updated if relevant.  ROS: See HPI Patient reports no  vision/ hearing changes,anorexia, weight change, fever ,adenopathy, persistant / recurrent hoarseness, swallowing issues, chest pain, edema,persistant / recurrent cough, hemoptysis, dyspnea(rest, exertional, paroxysmal nocturnal), gastrointestinal  bleeding (melena, rectal bleeding), abdominal pain, excessive heart burn, GU symptoms(dysuria, hematuria, pyuria, voiding/incontinence  Issues) syncope, focal weakness, severe memory loss, concerning skin lesions, depression, anxiety, abnormal bruising/bleeding, major joint swelling, breast masses or abnormal vaginal bleeding.     Physical Exam BP 118/80  Pulse 86  Temp(Src) 98.3 F (36.8 C) (Oral)  Ht 5' 7.5" (1.715 m)  Wt 169 lb 4 oz (76.771 kg)  BMI 26.12 kg/m2  General:   Well-developed,well-nourished,in no acute distress; alert,appropriate and cooperative throughout examination Head:  normocephalic and atraumatic.   Eyes:  vision grossly intact, pupils equal, pupils round, and pupils reactive to light.   Ears:  R ear normal and L ear normal.   Nose:  no external deformity.   Mouth:  good dentition.   Neck:  No deformities, masses, or tenderness noted. Breasts:  No mass, nodules, thickening, tenderness, bulging, retraction, inflamation, nipple discharge or skin changes noted.   Lungs:  Normal respiratory effort, chest expands symmetrically. Lungs are clear to auscultation, no crackles or wheezes. Heart:  Normal rate and regular rhythm. S1 and S2 normal without gallop, murmur, click, rub or other extra sounds. Abdomen:  Bowel sounds positive,abdomen soft and non-tender without masses, organomegaly or hernias noted. Rectal:  no external abnormalities.   Genitalia:  Pelvic Exam:        External: normal female genitalia without lesions or masses        Vagina: normal without lesions or masses        Cervix: normal without lesions or masses        Adnexa: normal bimanual exam without masses or fullness        Uterus: absent        Pap smear: performed Msk:  No deformity or scoliosis noted of thoracic or lumbar spine.   Extremities:  No clubbing, cyanosis, edema, or deformity noted with normal full range of motion of all joints.   Neurologic:  alert & oriented X3 and gait normal.   Skin:  Intact without suspicious lesions or rashes Cervical Nodes:  No lymphadenopathy noted Axillary Nodes:  No palpable lymphadenopathy Psych:  Cognition and judgment appear intact. Alert and cooperative with normal attention span and concentration. No apparent delusions, illusions, hallucinations   Assessment and Plan:  1. HYPOTHYROIDISM   Stable.  Continue current dose. levothyroxine (SYNTHROID, LEVOTHROID) 125 MCG tablet  2. HYPERCHOLESTEROLEMIA   Stable.  Continue  Niaspan. niacin (NIASPAN) 500 MG CR tablet  3. Routine general medical examination at a health care facility   Reviewed preventive care protocols, scheduled due services, and updated immunizations Discussed nutrition, exercise, diet, and healthy lifestyle.  Cytology -Pap Smear Colonoscopy  4. Diabetes mellitus  Recheck labs today, pt would like to control with diet alone. Basic Metabolic Panel (BMET), HgB A1c

## 2011-04-04 NOTE — Patient Instructions (Signed)
Good to see you. Please stop by to see Kelly Wyatt on your way out. 

## 2011-04-05 ENCOUNTER — Other Ambulatory Visit: Payer: 59

## 2011-04-05 ENCOUNTER — Other Ambulatory Visit: Payer: Self-pay | Admitting: Family Medicine

## 2011-04-05 DIAGNOSIS — R739 Hyperglycemia, unspecified: Secondary | ICD-10-CM

## 2011-04-10 ENCOUNTER — Encounter: Payer: Self-pay | Admitting: *Deleted

## 2011-04-12 ENCOUNTER — Encounter: Payer: Self-pay | Admitting: Family Medicine

## 2011-04-13 ENCOUNTER — Encounter: Payer: Self-pay | Admitting: *Deleted

## 2011-12-17 ENCOUNTER — Other Ambulatory Visit: Payer: Self-pay

## 2011-12-17 DIAGNOSIS — E039 Hypothyroidism, unspecified: Secondary | ICD-10-CM

## 2011-12-17 MED ORDER — LEVOTHYROXINE SODIUM 125 MCG PO TABS: 125.0000 ug | ORAL_TABLET | Freq: Every day | ORAL | Status: DC

## 2011-12-17 NOTE — Telephone Encounter (Signed)
Pt said mail order pharmacy told her need new rx for synthroid, name brand only. Synthroid 125 mcg DAW # 90 x0 [facility] to [facility] and pt advised while on phone.

## 2011-12-17 NOTE — Telephone Encounter (Signed)
Pt left v/m requesting name brand synthroid 125 mcg faxed to [facility] [phone]. Pt should have refills thru 03/2012. Left v/m for pt to call back.

## 2012-02-29 ENCOUNTER — Other Ambulatory Visit: Payer: Self-pay | Admitting: *Deleted

## 2012-02-29 DIAGNOSIS — E039 Hypothyroidism, unspecified: Secondary | ICD-10-CM

## 2012-02-29 MED ORDER — LEVOTHYROXINE SODIUM 125 MCG PO TABS: 125.0000 ug | ORAL_TABLET | Freq: Every day | ORAL | Status: DC

## 2012-06-26 ENCOUNTER — Other Ambulatory Visit: Payer: Self-pay | Admitting: *Deleted

## 2012-06-26 MED ORDER — ESTROGENS CONJUGATED 0.9 MG PO TABS: 0.9000 mg | ORAL_TABLET | Freq: Every day | ORAL | Status: DC

## 2012-07-14 ENCOUNTER — Telehealth: Payer: Self-pay | Admitting: Family Medicine

## 2012-07-14 DIAGNOSIS — Z1231 Encounter for screening mammogram for malignant neoplasm of breast: Secondary | ICD-10-CM

## 2012-07-14 NOTE — Telephone Encounter (Signed)
Patient is due for her mammogram.  She'd like it to be scheduled before the end of the year.  She has her mammograms done at [facility] and wants to continue going there.

## 2012-07-14 NOTE — Telephone Encounter (Signed)
Order entered

## 2012-07-14 NOTE — Addendum Note (Signed)
Addended by: Dianne Dun on: 07/14/2012 12:59 PM   Modules accepted: Orders

## 2012-07-16 ENCOUNTER — Encounter: Payer: Self-pay | Admitting: Family Medicine

## 2012-07-16 ENCOUNTER — Ambulatory Visit (INDEPENDENT_AMBULATORY_CARE_PROVIDER_SITE_OTHER): Payer: 59 | Admitting: Family Medicine

## 2012-07-16 VITALS — BP 120/80 | HR 80 | Temp 97.7°F | Ht 65.5 in | Wt 172.0 lb

## 2012-07-16 DIAGNOSIS — E119 Type 2 diabetes mellitus without complications: Secondary | ICD-10-CM

## 2012-07-16 DIAGNOSIS — Z23 Encounter for immunization: Secondary | ICD-10-CM

## 2012-07-16 DIAGNOSIS — Z0000 Encounter for general adult medical examination without abnormal findings: Secondary | ICD-10-CM

## 2012-07-16 DIAGNOSIS — E7800 Pure hypercholesterolemia, unspecified: Secondary | ICD-10-CM

## 2012-07-16 DIAGNOSIS — E039 Hypothyroidism, unspecified: Secondary | ICD-10-CM

## 2012-07-16 DIAGNOSIS — M9981 Other biomechanical lesions of cervical region: Secondary | ICD-10-CM

## 2012-07-16 LAB — COMPREHENSIVE METABOLIC PANEL
ALT: 15 U/L (ref 0–35)
AST: 17 U/L (ref 0–37)
Albumin: 4.4 g/dL (ref 3.5–5.2)
BUN: 14 mg/dL (ref 6–23)
CO2: 31 mEq/L (ref 19–32)
Calcium: 9.2 mg/dL (ref 8.4–10.5)
Chloride: 101 mEq/L (ref 96–112)
GFR: 68.27 mL/min (ref >60.00)
Potassium: 4.4 mEq/L (ref 3.5–5.1)

## 2012-07-16 LAB — HEMOGLOBIN A1C: Hgb A1c MFr Bld: 6.2 % (ref 4.6–6.5)

## 2012-07-16 LAB — LIPID PANEL
Cholesterol: 328 mg/dL — ABNORMAL HIGH (ref 0–200)
Total CHOL/HDL Ratio: 3

## 2012-07-16 LAB — T4, FREE: Free T4: 0.83 ng/dL (ref 0.60–1.60)

## 2012-07-16 MED ORDER — DIAZEPAM 2 MG PO TABS: 2.0000 mg | ORAL_TABLET | Freq: Every day | ORAL | Status: DC

## 2012-07-16 MED ORDER — METAXALONE 800 MG PO TABS: 800.0000 mg | ORAL_TABLET | Freq: Three times a day (TID) | ORAL | Status: DC

## 2012-07-16 MED ORDER — ESTROGENS CONJUGATED 0.9 MG PO TABS: ORAL_TABLET | ORAL | Status: DC

## 2012-07-16 MED ORDER — BACLOFEN 10 MG PO TABS: ORAL_TABLET | ORAL | Status: DC

## 2012-07-16 MED ORDER — NORTRIPTYLINE HCL 50 MG PO CAPS: ORAL_CAPSULE | ORAL | Status: DC

## 2012-07-16 NOTE — Progress Notes (Signed)
51 yo female here for CPX.   In April had another cervical nerve block (C5-C7).  Had another block in July 2013 ( C2-C3).  Sees Dr. Marshell Garfinkel at [facility].  Still in pain everyday but hopeful that she will find relief soon.  Mammogram appointment set up for later today.  S/p hysterectomy 10 years ago for endometriosis (cervix intact). No h/o abnormal pap smears.  Last pap smear was performed here and was normal in 03/2011 (see path report in Epic).  Colonoscopy done in [place] last year- normal.  New onset DM- Lab Results  Component Value Date   HGBA1C 6.4 04/04/2011  Has been working on her diet- cut out sugars/carbs.  Wants to avoid DM medications if at all possible.  Weight is relatively stable. Wt Readings from Last 3 Encounters:  07/16/12 172 lb (78.019 kg)  04/04/11 169 lb 4 oz (76.771 kg)  02/28/11 173 lb 8 oz (78.699 kg)     HLD-  has had  elevated lipid panel in past, then we checked them in 10/2009 and LDL was 187, TG 167, HDL 93.  Started her on statin which worsened her muscle aches.  Then started Niaspan which we have increased to 1000 mg qhs and lipids improved but she stopped taking it.  She is not sure why she stopped taking it.    Due for lipid panel today. Lab Results  Component Value Date   CHOL 242* 02/28/2011   HDL 97.70 02/28/2011   LDLDIRECT 125.3 02/28/2011   TRIG 74.0 02/28/2011   CHOLHDL 2 02/28/2011     Graves disease- on  synthroid to 125  micrograms daily.  Denies any symptoms of hypo or hyperthyroidism. Lab Results  Component Value Date   TSH 3.27 02/28/2011     Patient Active Problem List  Diagnosis  . GRAVES DISEASE  . HYPOTHYROIDISM  . HYPERCHOLESTEROLEMIA  . EUSTACHIAN TUBE DYSFUNCTION  . INTERNAL HEMORRHOIDS  . URI  . DIVERTICULOSIS, COLON  . IRRITABLE BOWEL SYNDROME  . OCCIPITAL NEURALGIA  . CERVICAL SOMATIC DYSFUNCTION  . SWEATING  . Routine general medical examination at a health care facility  . Diabetes mellitus   Past Medical  History  Diagnosis Date  . Diverticulosis of colon 09/06/2004   Past Surgical History  Procedure Date  . Abdominal hysterectomy     cervix left in   History  Substance Use Topics  . Smoking status: Never Smoker   . Smokeless tobacco: Not on file  . Alcohol Use: Not on file   No family history on file. No Known Allergies Current Outpatient Prescriptions on File Prior to Visit  Medication Sig Dispense Refill  . diazepam (VALIUM) 2 MG tablet Take 2 mg by mouth daily.        Marland Kitchen estrogens, conjugated, (PREMARIN) 0.9 MG tablet Take 1 tablet (0.9 mg total) by mouth daily. Take daily for 21 days then do not take for 7 days. * Needs and appointment with Dr for any additional refills*  90 tablet  0  . levothyroxine (SYNTHROID, LEVOTHROID) 125 MCG tablet Take 1 tablet (125 mcg total) by mouth daily.  90 tablet  0  . metaxalone (SKELAXIN) 800 MG tablet Take 800 mg by mouth 3 (three) times daily.        . niacin (NIASPAN) 500 MG CR tablet Take two tablets by mouth at bedtime.  180 tablet  3  . nortriptyline (PAMELOR) 50 MG capsule Take 150mg  daily       . oxyCODONE-acetaminophen (PERCOCET) 10-325  MG per tablet Take 1 tablet by mouth every 4 (four) hours as needed.         The PMH, PSH, Social History, Family History, Medications, and allergies have been reviewed in [facility], and have been updated if relevant.  ROS: See HPI Patient reports no  vision/ hearing changes,anorexia, weight change, fever ,adenopathy, persistant / recurrent hoarseness, swallowing issues, chest pain, edema,persistant / recurrent cough, hemoptysis, dyspnea(rest, exertional, paroxysmal nocturnal), gastrointestinal  bleeding (melena, rectal bleeding), abdominal pain, excessive heart burn, GU symptoms(dysuria, hematuria, pyuria, voiding/incontinence  Issues) syncope, focal weakness, severe memory loss, concerning skin lesions, depression, anxiety, abnormal bruising/bleeding, major joint swelling, breast masses or abnormal vaginal  bleeding.     Physical Exam BP 120/80  Pulse 80  Temp 97.7 F (36.5 C)  Ht 5' 5.5" (1.664 m)  Wt 172 lb (78.019 kg)  BMI 28.19 kg/m2  General:  Well-developed,well-nourished,in no acute distress; alert,appropriate and cooperative throughout examination Head:  normocephalic and atraumatic.   Eyes:  vision grossly intact, pupils equal, pupils round, and pupils reactive to light.   Ears:  R ear normal and L ear normal.   Nose:  no external deformity.   Mouth:  good dentition.   Neck:  No deformities, masses, or tenderness noted. Breasts:  No mass, nodules, thickening, tenderness, bulging, retraction, inflamation, nipple discharge or skin changes noted.   Lungs:  Normal respiratory effort, chest expands symmetrically. Lungs are clear to auscultation, no crackles or wheezes. Heart:  Normal rate and regular rhythm. S1 and S2 normal without gallop, murmur, click, rub or other extra sounds. Abdomen:  Bowel sounds positive,abdomen soft and non-tender without masses, organomegaly or hernias noted. Rectal:  no external abnormalities.   Msk:  No deformity or scoliosis noted of thoracic or lumbar spine.   Extremities:  No clubbing, cyanosis, edema, or deformity noted with normal full range of motion of all joints.   Neurologic:  alert & oriented X3 and gait normal.   Skin:  Intact without suspicious lesions or rashes Cervical Nodes:  No lymphadenopathy noted Axillary Nodes:  No palpable lymphadenopathy Psych:  Cognition and judgment appear intact. Alert and cooperative with normal attention span and concentration. No apparent delusions, illusions, hallucinations   Assessment and Plan:  1. HYPOTHYROIDISM   Asymptomatic.  Recheck TSH, FT4 today. levothyroxine (SYNTHROID, LEVOTHROID) 125 MCG tablet  2. HYPERCHOLESTEROLEMIA    Restart Niaspan. Check lipid panel, CMET today. niacin (NIASPAN) 500 MG CR tablet  3. Routine general medical examination at a health care facility   Reviewed  preventive care protocols, scheduled due services, and updated immunizations Discussed nutrition, exercise, diet, and healthy lifestyle.  Flu immunization  4. Diabetes mellitus  Recheck labs today, pt would like to control with diet alone. Basic Metabolic Panel (BMET), HgB A1c

## 2012-07-16 NOTE — Patient Instructions (Addendum)
Have a wonderful Christmas and I hope your mother starts feeling better soon. We will call you with your lab results.

## 2012-07-16 NOTE — Addendum Note (Signed)
Addended by: Eliezer Bottom on: 07/16/2012 11:11 AM   Modules accepted: Orders

## 2012-07-17 ENCOUNTER — Encounter: Payer: Self-pay | Admitting: Family Medicine

## 2012-07-17 ENCOUNTER — Encounter: Payer: Self-pay | Admitting: *Deleted

## 2012-07-17 MED ORDER — NIACIN ER (ANTIHYPERLIPIDEMIC) 500 MG PO TBCR: EXTENDED_RELEASE_TABLET | ORAL | Status: DC

## 2012-07-17 NOTE — Addendum Note (Signed)
Addended by: Eliezer Bottom on: 07/17/2012 02:24 PM   Modules accepted: Orders

## 2012-07-25 ENCOUNTER — Other Ambulatory Visit: Payer: Self-pay | Admitting: Family Medicine

## 2012-08-15 ENCOUNTER — Telehealth: Payer: Self-pay

## 2012-08-15 NOTE — Telephone Encounter (Signed)
Pt seen in [place] ED had CT scan thinking might be problem with gall bladder or appendix but scan was OK. Pt was having extreme nausea and vomiting and pain on rt side of abdomen. Pt will not be back in town possibly until next week. Pt feels some better today but still has fullness in stomach and feels very tired. Pt will have to call back for f/u appt with Dr Dayton Martes when knows for sure when she will be back in our area. Pt just wanted Dr Dayton Martes to know what is going.

## 2012-08-18 NOTE — Telephone Encounter (Signed)
Please call to check on pt. 

## 2012-08-18 NOTE — Telephone Encounter (Signed)
Spoke to patient and was advised that she is doing better and will work on finding a new doctor in her area soon.

## 2012-09-11 ENCOUNTER — Telehealth: Payer: Self-pay

## 2012-09-11 ENCOUNTER — Ambulatory Visit: Payer: Self-pay | Admitting: Family Medicine

## 2012-09-11 ENCOUNTER — Encounter: Payer: Self-pay | Admitting: Family Medicine

## 2012-09-11 ENCOUNTER — Ambulatory Visit (INDEPENDENT_AMBULATORY_CARE_PROVIDER_SITE_OTHER): Payer: Commercial Managed Care - PPO | Admitting: Family Medicine

## 2012-09-11 VITALS — BP 102/76 | HR 80 | Temp 98.1°F | Wt 176.0 lb

## 2012-09-11 DIAGNOSIS — K589 Irritable bowel syndrome without diarrhea: Secondary | ICD-10-CM

## 2012-09-11 DIAGNOSIS — R1011 Right upper quadrant pain: Secondary | ICD-10-CM

## 2012-09-11 MED ORDER — LINACLOTIDE 145 MCG PO CAPS: 145.0000 ug | ORAL_CAPSULE | Freq: Every day | ORAL | Status: DC

## 2012-09-11 NOTE — Patient Instructions (Addendum)
Linaclotide oral capsules What is this medicine? LINACLOTIDE (lin a KLOE tide) is used to treat irritable bowel syndrome (IBS) with constipation as the main problem. It may also be used for relief of chronic constipation. This medicine may be used for other purposes; ask your health care provider or pharmacist if you have questions. What should I tell my health care provider before I take this medicine? They need to know if you have any of these conditions: -history of stool (fecal) impaction -now have diarrhea or have diarrhea often -other medical condition -stomach or intestinal disease, including bowel obstruction or abdominal adhesions -an unusual or allergic reaction to linaclotide, other medicines, foods, dyes, or preservatives -pregnant or trying to get pregnant -breast-feeding How should I use this medicine? Take this medicine by mouth with a glass of water. Follow the directions on the prescription label. Do not cut, crush or chew this medicine. Take on an empty stomach, at least 30 minutes before your first meal of the day. Take your medicine at regular intervals. Do not take your medicine more often than directed. Do not stop taking except on your doctor's advice. A special MedGuide will be given to you by the pharmacist with each prescription and refill. Be sure to read this information carefully each time. Talk to your pediatrician regarding the use of this medicine in children. This medicine is not approved for use in children. Overdosage: If you think you've taken too much of this medicine contact a poison control center or emergency room at once. Overdosage: If you think you have taken too much of this medicine contact a poison control center or emergency room at once. NOTE: This medicine is only for you. Do not share this medicine with others. What if I miss a dose? If you miss a dose, just skip that dose. Wait until your next dose, and take only that dose. Do not take double or  extra doses. What may interact with this medicine? -certain medicines for bowel problems or bladder incontinence (these can cause constipation) This list may not describe all possible interactions. Give your health care provider a list of all the medicines, herbs, non-prescription drugs, or dietary supplements you use. Also tell them if you smoke, drink alcohol, or use illegal drugs. Some items may interact with your medicine. What should I watch for while using this medicine? Visit your doctor for regular check ups. Tell your doctor if your symptoms do not get better or if they get worse. Diarrhea is a common side effect of this medicine. It often begins within 2 weeks of starting this medicine. Stop taking this medicine and call your doctor if you get severe diarrhea. Stop taking this medicine and call your doctor or go to the nearest hospital emergency room right away if you develop unusual or severe stomach-area (abdominal) pain, especially if you also have bright red, bloody stools or black stools that look like tar. What side effects may I notice from receiving this medicine? Side effects that you should report to your doctor or health care professional as soon as possible: -allergic reactions like skin rash, itching or hives, swelling of the face, lips, or tongue -black, tarry stools -bloody or watery diarrhea -new or worsening stomach pain -severe or prolonged diarrhea  Side effects that usually do not require medical attention (Report these to your doctor or health care professional if they continue or are bothersome.): -bloating -gas -loose stools This list may not describe all possible side effects. Call your doctor  for medical advice about side effects. You may report side effects to FDA at 1-800-FDA-1088. Where should I keep my medicine? Keep out of the reach of children. Store at room temperature between 20 and 25 degrees C (68 and 77 degrees F). Keep this medicine in the original  container. Keep tightly closed in a dry place. Do not remove the desiccant packet from the bottle, it helps to protect your medicine from moisture. Throw away any unused medicine after the expiration date. NOTE: This sheet is a summary. It may not cover all possible information. If you have questions about this medicine, talk to your doctor, pharmacist, or health care provider.  2013, [employer]. (04/10/2011 1:05:27 PM)

## 2012-09-11 NOTE — Telephone Encounter (Signed)
Ok to give her samples of the higher dose we have in stock for now.

## 2012-09-11 NOTE — Telephone Encounter (Signed)
Pt seen this morning;pt received call from pharmacy that medication, Linzess will require prior auth and pharmacy cannot fill until letter written for approval of med. Pt wanted to know if that was going to take some time to get approved can Dr Dayton Martes prescribe a different med.Please advise.

## 2012-09-11 NOTE — Progress Notes (Signed)
52 yo female with complicated medical history, including cervical somatic dysfunction here for:  1.  RUQ with nausea and vomiting-  Per pt, went to ER in [place] for several episodes of RUQ pain and vomiting a few weeks ago.  She is unsure if eating made her symptoms worse.  We do not yet have these records but she states that they "ruled out that her appendix was an issue." She was d/c'd home with diagnosis of gastroenteritis.  No diarrhea or fevers were associated.   Last night, she was eating a pot pie with her friend and immediately became nauseated and vomited.  No diarrhea, no fevers.  She did have some RUQ pain that has now resolved.  2. IBS with constipation- constipation has been getting progressively worse.  At times, it gets so bad she takes her mother's lactulose! Colonoscopy done in [place] last year- normal.  No blood in stool.   Patient Active Problem List  Diagnosis  . GRAVES DISEASE  . HYPOTHYROIDISM  . HYPERCHOLESTEROLEMIA  . INTERNAL HEMORRHOIDS  . DIVERTICULOSIS, COLON  . OCCIPITAL NEURALGIA  . CERVICAL SOMATIC DYSFUNCTION  . Diabetes mellitus   Past Medical History  Diagnosis Date  . Diverticulosis of colon 09/06/2004   Past Surgical History  Procedure Date  . Abdominal hysterectomy     cervix left in   History  Substance Use Topics  . Smoking status: Never Smoker   . Smokeless tobacco: Not on file  . Alcohol Use: Not on file   No family history on file. No Known Allergies Current Outpatient Prescriptions on File Prior to Visit  Medication Sig Dispense Refill  . baclofen (LIORESAL) 10 MG tablet Take 40 to 60 mg's by mouth daily  90 each  1  . diazepam (VALIUM) 2 MG tablet Take 1 tablet (2 mg total) by mouth daily.  90 tablet  1  . estrogens, conjugated, (PREMARIN) 0.9 MG tablet Take daily for 21 days then do not take for 7 days.  90 tablet  1  . metaxalone (SKELAXIN) 800 MG tablet Take 1 tablet (800 mg total) by mouth 3 (three) times daily.   270 tablet  1  . niacin (NIASPAN) 500 MG CR tablet Take 2 by mouth at bedtime.  180 tablet  3  . nortriptyline (PAMELOR) 50 MG capsule Take 3 by mouth daily  270 capsule  1  . oxyCODONE-acetaminophen (PERCOCET) 10-325 MG per tablet Take 1 tablet by mouth every 4 (four) hours as needed.        Marland Kitchen SYNTHROID 125 MCG tablet Take 1 tablet by mouth  daily  90 each  1  . Linaclotide (LINZESS) 145 MCG CAPS Take 1 capsule (145 mcg total) by mouth daily.  30 capsule  1   The PMH, PSH, Social History, Family History, Medications, and allergies have been reviewed in [facility], and have been updated if relevant.  ROS: See HPI   Physical Exam BP 102/76  Pulse 80  Temp 98.1 F (36.7 C)  Wt 176 lb (79.833 kg)  General:  Well-developed,well-nourished,in no acute distress; alert,appropriate and cooperative throughout examination Head:  normocephalic and atraumatic.   Eyes:  vision grossly intact, pupils equal, pupils round, and pupils reactive to light.   Ears:  R ear normal and L ear normal.   Nose:  no external deformity.   Mouth:  good dentition.   Neck:  No deformities, masses, or tenderness noted. Breasts:  No mass, nodules, thickening, tenderness, bulging, retraction, inflamation, nipple discharge or skin  changes noted.   Lungs:  Normal respiratory effort, chest expands symmetrically. Lungs are clear to auscultation, no crackles or wheezes. Heart:  Normal rate and regular rhythm. S1 and S2 normal without gallop, murmur, click, rub or other extra sounds. Abdomen:  Bowel sounds positive,abdomen soft and non-tender without masses, organomegaly or hernias noted. Msk:  No deformity or scoliosis noted of thoracic or lumbar spine.   Extremities:  No clubbing, cyanosis, edema, or deformity noted with normal full range of motion of all joints.   Neurologic:  alert & oriented X3 and gait normal.   Skin:  Intact without suspicious lesions or rashes Psych:  Cognition and judgment appear intact. Alert and  cooperative with normal attention span and concentration. No apparent delusions, illusions, hallucinations   Assessment and Plan:  1. RUQ pain  Deteriorated and intermittent- ? Gallstones/ biliary colic.  Will order ultrasound of RUQ for further evaluation. The patient indicates understanding of these issues and agrees with the plan.  US Abdomen Limited RUQ  2. Irritable bowel syndrome  Deteriorated- with constipation.  Will start Linzess.  Pt to call in a few weeks with an update. The patient indicates understanding of these issues and agrees with the plan.

## 2012-09-12 ENCOUNTER — Encounter: Payer: Self-pay | Admitting: Family Medicine

## 2012-09-12 NOTE — Telephone Encounter (Signed)
Advised patient.  11 boxes of # 4 each given, lot number [id number], exp 11/03/2012.  Patient to send a friend to pick them up.

## 2012-09-16 ENCOUNTER — Encounter: Payer: 59 | Admitting: Family Medicine

## 2012-09-17 ENCOUNTER — Other Ambulatory Visit: Payer: 59

## 2012-10-01 ENCOUNTER — Telehealth: Payer: Self-pay

## 2012-10-01 ENCOUNTER — Encounter: Payer: Self-pay | Admitting: Family Medicine

## 2012-10-01 ENCOUNTER — Ambulatory Visit (INDEPENDENT_AMBULATORY_CARE_PROVIDER_SITE_OTHER): Payer: Commercial Managed Care - PPO | Admitting: Family Medicine

## 2012-10-01 VITALS — BP 110/80 | HR 68 | Temp 97.9°F

## 2012-10-01 DIAGNOSIS — K589 Irritable bowel syndrome without diarrhea: Secondary | ICD-10-CM | POA: Insufficient documentation

## 2012-10-01 NOTE — Progress Notes (Signed)
52 yo female with complicated medical history, including cervical somatic dysfunction here for 2 week follow up.  1.  RUQ with nausea and vomiting-  Per pt, went to ER in [place] for several episodes of RUQ pain and vomiting last month.  She is unsure if eating made her symptoms worse.   She was d/c'd home with diagnosis of gastroenteritis.  No diarrhea or fevers were associated.  She continued to have intermittent nausea, vomiting with some mild RUQ pain so I ordered a RUQ abdominal ultrasound performed on 09/11/2012 which was unremarkable.  Her RUQ pain has resolved but and has not vomited since.  Still has some epigastric discomfort and gassiness.   2. IBS with constipation- constipation has been getting progressively worse.  At times, it gets so bad she takes her mother's lactulose! Colonoscopy done in [place] last year- normal.  No blood in stool.  At last office visit, I prescribed Linzess.  She still feels very constipated.  Had to use miralax and dulcolax last week.   Patient Active Problem List  Diagnosis  . GRAVES DISEASE  . HYPOTHYROIDISM  . HYPERCHOLESTEROLEMIA  . INTERNAL HEMORRHOIDS  . DIVERTICULOSIS, COLON  . OCCIPITAL NEURALGIA  . CERVICAL SOMATIC DYSFUNCTION  . Diabetes mellitus   Past Medical History  Diagnosis Date  . Diverticulosis of colon 09/06/2004   Past Surgical History  Procedure Laterality Date  . Abdominal hysterectomy      cervix left in   History  Substance Use Topics  . Smoking status: Never Smoker   . Smokeless tobacco: Not on file  . Alcohol Use: Not on file   No family history on file. No Known Allergies Current Outpatient Prescriptions on File Prior to Visit  Medication Sig Dispense Refill  . baclofen (LIORESAL) 10 MG tablet Take 40 to 60 mg's by mouth daily  90 each  1  . diazepam (VALIUM) 2 MG tablet Take 1 tablet (2 mg total) by mouth daily.  90 tablet  1  . estrogens, conjugated, (PREMARIN) 0.9 MG tablet Take daily for 21  days then do not take for 7 days.  90 tablet  1  . Linaclotide (LINZESS) 145 MCG CAPS Take 1 capsule (145 mcg total) by mouth daily.  30 capsule  1  . metaxalone (SKELAXIN) 800 MG tablet Take 1 tablet (800 mg total) by mouth 3 (three) times daily.  270 tablet  1  . niacin (NIASPAN) 500 MG CR tablet Take 2 by mouth at bedtime.  180 tablet  3  . nortriptyline (PAMELOR) 50 MG capsule Take 3 by mouth daily  270 capsule  1  . oxyCODONE-acetaminophen (PERCOCET) 10-325 MG per tablet Take 1 tablet by mouth every 4 (four) hours as needed.        Marland Kitchen SYNTHROID 125 MCG tablet Take 1 tablet by mouth  daily  90 each  1   No current facility-administered medications on file prior to visit.   The PMH, PSH, Social History, Family History, Medications, and allergies have been reviewed in [facility], and have been updated if relevant.  ROS: See HPI   Physical Exam BP 110/80  Pulse 68  Temp(Src) 97.9 F (36.6 C)  General:  Well-developed,well-nourished,in no acute distress; alert,appropriate and cooperative throughout examination Head:  normocephalic and atraumatic.   Eyes:  vision grossly intact, pupils equal, pupils round, and pupils reactive to light.   Ears:  R ear normal and L ear normal.   Nose:  no external deformity.   Mouth:  good  dentition.   Neck:  No deformities, masses, or tenderness noted. Breasts:  No mass, nodules, thickening, tenderness, bulging, retraction, inflamation, nipple discharge or skin changes noted.   Lungs:  Normal respiratory effort, chest expands symmetrically. Lungs are clear to auscultation, no crackles or wheezes. Heart:  Normal rate and regular rhythm. S1 and S2 normal without gallop, murmur, click, rub or other extra sounds. Abdomen:  Bowel sounds positive,abdomen soft and non-tender without masses, organomegaly or hernias noted. Msk:  No deformity or scoliosis noted of thoracic or lumbar spine.   Extremities:  No clubbing, cyanosis, edema, or deformity noted with normal  full range of motion of all joints.   Neurologic:  alert & oriented X3 and gait normal.   Skin:  Intact without suspicious lesions or rashes Psych:  Cognition and judgment appear intact. Alert and cooperative with normal attention span and concentration. No apparent delusions, illusions, hallucinations   Assessment and Plan: 1. IBS (irritable bowel syndrome) Remains constipated and now with increased gassiness.  Will add Pepcid and Align to Linzess- continue for Linzess for another two weeks. See pt instructions for supportive care.  2.  RUQ pain- Resolved.  ? Gas pain from dyspepsia.  See above.

## 2012-10-01 NOTE — Patient Instructions (Addendum)
Increase fiber and water, and try over the counter gas-x (four times a day when necessary) or beano for bloating.   pepcid 20 mg daily for next 2 weeks.  We could also try Align (over the counter).  Exclude gas producing foods (beans, onions, celery, carrots, raisins, bananas, apricots, prunes, brussel sprouts, wheat germ, pretzels)   Let's continue the Linzess.  Call me with an update.

## 2012-10-01 NOTE — Telephone Encounter (Signed)
Pt left v/m; pt saw Dr Dayton Martes and pharmacy to send office PA for Linzess. Pt request new rx for Linzess 290 mcg. Pt presently taking samples.Please advise.

## 2012-10-02 NOTE — Telephone Encounter (Signed)
Yes ok to change dose and send new rx.

## 2012-10-02 NOTE — Telephone Encounter (Signed)
Dr. Dayton Martes, ok to change dose on med list and send in new script?

## 2012-10-03 NOTE — Telephone Encounter (Signed)
Dosage changed in chart.  Linzess will need prior auth, form is on your desk.  Notes say that pt has tried miralax, dulcolax and lactulose previously.

## 2012-10-06 NOTE — Telephone Encounter (Signed)
Form completed and in my box.

## 2012-10-06 NOTE — Telephone Encounter (Signed)
Form faxed

## 2012-10-07 ENCOUNTER — Other Ambulatory Visit: Payer: Self-pay | Admitting: *Deleted

## 2012-10-07 MED ORDER — LINACLOTIDE 290 MCG PO CAPS: 1.0000 | ORAL_CAPSULE | Freq: Every day | ORAL | Status: DC

## 2012-10-07 NOTE — Telephone Encounter (Signed)
Pt request linzess 290 mg called in, she states she just spoke with pharmacy and it had been authorized by ins. She would prefer to have the 290 mg dose called in, b/c it cost $50 for both strengths and she is currently on the 290mg 

## 2012-10-07 NOTE — Telephone Encounter (Signed)
Prior auth approval given for linzess.  Pharmacy advised.  Approval letter placed on doctor's desk for signature and scanning.

## 2012-11-18 ENCOUNTER — Other Ambulatory Visit: Payer: Self-pay | Admitting: *Deleted

## 2012-11-18 MED ORDER — LINACLOTIDE 290 MCG PO CAPS: 1.0000 | ORAL_CAPSULE | Freq: Every day | ORAL | Status: DC

## 2012-12-02 ENCOUNTER — Telehealth: Payer: Self-pay

## 2012-12-02 NOTE — Telephone Encounter (Signed)
Pt plans to go on mission trip to [place] 03/04/13; pt has checked on line for CDC recommendations for travel; pt to have Typhoid,Tdap,Hep A, and chicken pox. Pt had series of Hep B done previously and does pt need titer done for Hep B? Pt wants to know if Dr Dayton Martes could give discount on cost of immunizations due to it being a mission trip. Advised pt she could ck with health dept. Pt said Dr Dayton Martes knows her conditions and wants Dr Dellie Catholic opinion of what shots she will need to take. Pt will ck with insurance for coverage of immunizations and will ck with health dept while waiting on call back.

## 2012-12-02 NOTE — Telephone Encounter (Signed)
Advised patient as instructed.  She will check with the health dept for vaccines and will call back to schedule lab appt for titres.

## 2012-12-02 NOTE — Telephone Encounter (Signed)
I would recommend Hep B and chicken pox titers( blood work we can do here). We can also give Tdap (she had hers in 2009 so she is up to date) and Hep A here but she would need to get Typhoid at health department. Discounts would be something we would have to check with Hansel Starling or Carlena Sax- please check with them.

## 2012-12-24 ENCOUNTER — Other Ambulatory Visit: Payer: Self-pay | Admitting: Family Medicine

## 2012-12-24 ENCOUNTER — Other Ambulatory Visit (INDEPENDENT_AMBULATORY_CARE_PROVIDER_SITE_OTHER): Payer: Commercial Managed Care - PPO

## 2012-12-24 DIAGNOSIS — Z2089 Contact with and (suspected) exposure to other communicable diseases: Secondary | ICD-10-CM

## 2012-12-24 NOTE — Progress Notes (Signed)
Pt needs Hep B titers.  She wants to come in today to have done.  Ok to order.

## 2012-12-25 ENCOUNTER — Other Ambulatory Visit: Payer: Commercial Managed Care - PPO

## 2012-12-25 LAB — HEPATITIS B SURFACE ANTIBODY, QUANTITATIVE: Hepatitis B-Post: 0.4 m[IU]/mL

## 2012-12-30 ENCOUNTER — Other Ambulatory Visit: Payer: Self-pay

## 2012-12-30 MED ORDER — LINACLOTIDE 290 MCG PO CAPS: 1.0000 | ORAL_CAPSULE | Freq: Every day | ORAL | Status: DC

## 2012-12-30 MED ORDER — LEVOTHYROXINE SODIUM 125 MCG PO TABS: ORAL_TABLET | ORAL | Status: DC

## 2012-12-30 NOTE — Telephone Encounter (Signed)
Pt left v/m requesting refills on Linzess and synthroid to [facility]. Pt did not have labs in Feb 2014.Please advise.

## 2013-01-06 ENCOUNTER — Ambulatory Visit: Payer: Commercial Managed Care - PPO | Admitting: *Deleted

## 2013-01-06 DIAGNOSIS — Z23 Encounter for immunization: Secondary | ICD-10-CM

## 2013-01-07 ENCOUNTER — Telehealth: Payer: Self-pay | Admitting: *Deleted

## 2013-01-07 NOTE — Telephone Encounter (Signed)
Please verify with pt which dose she is taking.

## 2013-01-07 NOTE — Telephone Encounter (Signed)
Pharmacy request refill on cymbalta and it is not on patient medication list. Is it ok to refill?

## 2013-01-08 NOTE — Telephone Encounter (Signed)
Spoke with patient, she said this request was sent to the wrong provider.  Advised her we still need to know that she is on this medicine so that we have it on her med list.  Medicine added to list.

## 2013-02-27 ENCOUNTER — Other Ambulatory Visit: Payer: Self-pay | Admitting: Family Medicine

## 2013-04-08 ENCOUNTER — Other Ambulatory Visit: Payer: Self-pay | Admitting: *Deleted

## 2013-04-08 MED ORDER — LINACLOTIDE 290 MCG PO CAPS: 290.0000 ug | ORAL_CAPSULE | Freq: Every day | ORAL | Status: DC

## 2013-04-14 ENCOUNTER — Other Ambulatory Visit: Payer: Self-pay | Admitting: Family Medicine

## 2013-06-11 ENCOUNTER — Other Ambulatory Visit: Payer: Self-pay

## 2013-07-23 ENCOUNTER — Other Ambulatory Visit: Payer: Self-pay | Admitting: Family Medicine

## 2013-08-11 ENCOUNTER — Other Ambulatory Visit: Payer: Self-pay

## 2013-08-11 MED ORDER — LINACLOTIDE 290 MCG PO CAPS: 290.0000 ug | ORAL_CAPSULE | Freq: Every day | ORAL | Status: DC

## 2013-08-11 NOTE — Telephone Encounter (Signed)
Last filled 07/10/13--please advise

## 2013-08-23 ENCOUNTER — Other Ambulatory Visit: Payer: Self-pay | Admitting: Family Medicine

## 2013-10-08 ENCOUNTER — Other Ambulatory Visit: Payer: Self-pay | Admitting: Family Medicine

## 2013-10-09 ENCOUNTER — Other Ambulatory Visit: Payer: Self-pay | Admitting: Family Medicine

## 2013-11-18 ENCOUNTER — Encounter: Payer: Self-pay | Admitting: Family Medicine

## 2013-11-18 ENCOUNTER — Ambulatory Visit (INDEPENDENT_AMBULATORY_CARE_PROVIDER_SITE_OTHER): Payer: Commercial Managed Care - PPO | Admitting: Family Medicine

## 2013-11-18 VITALS — BP 124/70 | HR 75 | Temp 97.8°F | Ht 65.0 in | Wt 171.0 lb

## 2013-11-18 DIAGNOSIS — E039 Hypothyroidism, unspecified: Secondary | ICD-10-CM

## 2013-11-18 DIAGNOSIS — E119 Type 2 diabetes mellitus without complications: Secondary | ICD-10-CM

## 2013-11-18 DIAGNOSIS — E7800 Pure hypercholesterolemia, unspecified: Secondary | ICD-10-CM

## 2013-11-18 DIAGNOSIS — K589 Irritable bowel syndrome without diarrhea: Secondary | ICD-10-CM

## 2013-11-18 LAB — CBC WITH DIFFERENTIAL/PLATELET
BASOS ABS: 0 10*3/uL (ref 0.0–0.1)
Basophils Relative: 0.5 % (ref 0.0–3.0)
EOS ABS: 0.1 10*3/uL (ref 0.0–0.7)
Eosinophils Relative: 2.2 % (ref 0.0–5.0)
HCT: 36.8 % (ref 36.0–46.0)
HEMOGLOBIN: 12.3 g/dL (ref 12.0–15.0)
LYMPHS ABS: 2.6 10*3/uL (ref 0.7–4.0)
LYMPHS PCT: 42.2 % (ref 12.0–46.0)
MCHC: 33.5 g/dL (ref 30.0–36.0)
MCV: 93.3 fl (ref 78.0–100.0)
Monocytes Absolute: 0.6 10*3/uL (ref 0.1–1.0)
Monocytes Relative: 9.1 % (ref 3.0–12.0)
NEUTROS ABS: 2.8 10*3/uL (ref 1.4–7.7)
Neutrophils Relative %: 46 % (ref 43.0–77.0)
Platelets: 226 10*3/uL (ref 150.0–400.0)
RBC: 3.95 Mil/uL (ref 3.87–5.11)
RDW: 13.6 % (ref 11.5–14.6)
WBC: 6.1 10*3/uL (ref 4.5–10.5)

## 2013-11-18 LAB — COMPREHENSIVE METABOLIC PANEL
ALT: 21 U/L (ref 0–35)
AST: 19 U/L (ref 0–37)
Albumin: 3.7 g/dL (ref 3.5–5.2)
Alkaline Phosphatase: 50 U/L (ref 39–117)
BILIRUBIN TOTAL: 0.7 mg/dL (ref 0.3–1.2)
BUN: 22 mg/dL (ref 6–23)
CO2: 30 mEq/L (ref 19–32)
Calcium: 9 mg/dL (ref 8.4–10.5)
Chloride: 103 mEq/L (ref 96–112)
Creatinine, Ser: 0.7 mg/dL (ref 0.4–1.2)
GFR: 90.11 mL/min (ref >60.00)
Glucose, Bld: 108 mg/dL — ABNORMAL HIGH (ref 70–99)
Potassium: 4 mEq/L (ref 3.5–5.1)
Sodium: 139 mEq/L (ref 135–145)
Total Protein: 7.1 g/dL (ref 6.0–8.3)

## 2013-11-18 LAB — LIPID PANEL
CHOL/HDL RATIO: 2
Cholesterol: 268 mg/dL — ABNORMAL HIGH (ref 0–200)
HDL: 122.2 mg/dL (ref >39.00)
LDL Cholesterol: 129 mg/dL — ABNORMAL HIGH (ref 0–99)
Triglycerides: 82 mg/dL (ref 0.0–149.0)
VLDL: 16.4 mg/dL (ref 0.0–40.0)

## 2013-11-18 LAB — HEMOGLOBIN A1C: Hgb A1c MFr Bld: 6.2 % (ref 4.6–6.5)

## 2013-11-18 LAB — TSH: TSH: 1.61 u[IU]/mL (ref 0.35–5.50)

## 2013-11-18 LAB — T4, FREE: Free T4: 0.7 ng/dL (ref 0.60–1.60)

## 2013-11-18 MED ORDER — ESTROGENS CONJUGATED 0.9 MG PO TABS: ORAL_TABLET | ORAL | Status: DC

## 2013-11-18 MED ORDER — LEVOTHYROXINE SODIUM 125 MCG PO TABS: ORAL_TABLET | ORAL | Status: DC

## 2013-11-18 MED ORDER — NIACIN ER (ANTIHYPERLIPIDEMIC) 500 MG PO TBCR: EXTENDED_RELEASE_TABLET | ORAL | Status: DC

## 2013-11-18 MED ORDER — LINACLOTIDE 290 MCG PO CAPS: 290.0000 ug | ORAL_CAPSULE | Freq: Every day | ORAL | Status: DC

## 2013-11-18 NOTE — Assessment & Plan Note (Signed)
Due for labs. Check labs today. Orders Placed This Encounter  Procedures  . CBC with Differential  . Comprehensive metabolic panel  . Lipid panel  . TSH  . T4, Free  . Hemoglobin A1c

## 2013-11-18 NOTE — Patient Instructions (Signed)
Great to see you. Hang in there.  We will call you with your lab results.  Please keep your appointment for you physical.

## 2013-11-18 NOTE — Assessment & Plan Note (Signed)
On only niacin. Check labs today.

## 2013-11-18 NOTE — Progress Notes (Signed)
Pre visit review using our clinic review tool, if applicable. No additional management support is needed unless otherwise documented below in the visit note. 

## 2013-11-18 NOTE — Assessment & Plan Note (Signed)
Improved. Continue current dose of Linzess.

## 2013-11-18 NOTE — Progress Notes (Signed)
53 yo female with complicated medical history, including cervical somatic dysfunction here for med refills.  Has not been seen in over a year for routine care.  She scheduled a CPX when she checked in today.  Her mom's health has been deteriorating so she has been focused on caring for her.  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 Kelly Wyatt has wonderful support from her sisters and father.   IBS with constipation- on linzess.Symptoms much improved-"it has changed my life."  Having a BM every other day or daily.  Colonoscopy UTD. No blood in stool.    Hypothyroidism- on synthroid 125 mcg daily.  Overdue for labs.  Denies any symptoms of hypo or hyperthyroidism. Lab Results  Component Value Date   TSH 4.43 07/16/2012   HLD- on Niacin. Lab Results  Component Value Date   CHOL 328* 07/16/2012   HDL 106.80 07/16/2012   LDLDIRECT 200.3 07/16/2012   TRIG 97.0 07/16/2012   CHOLHDL 3 07/16/2012   Wt Readings from Last 3 Encounters:  11/18/13 171 lb (77.565 kg)  09/11/12 176 lb (79.833 kg)  07/16/12 172 lb (78.019 kg)    Patient Active Problem List   Diagnosis Date Noted  . IBS (irritable bowel syndrome) 10/01/2012  . Diabetes mellitus 04/04/2011  . CERVICAL SOMATIC DYSFUNCTION 03/27/2010  . HYPERCHOLESTEROLEMIA 04/27/2008  . HYPOTHYROIDISM 01/02/2007  . 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 INTERNAL HEMORRHOIDS 09/06/2004  . DIVERTICULOSIS, COLON 09/06/2004  . GRAVES DISEASE 02/04/2003   Past Medical History  Diagnosis Date  . Diverticulosis of colon 09/06/2004   Past Surgical History  Procedure Laterality Date  . Abdominal hysterectomy      cervix left in   History  Substance Use Topics  . Smoking status: Never Smoker   . Smokeless tobacco: Not on file  . Alcohol Use: Not on file   No family history on file. No Known Allergies Current Outpatient Prescriptions on File Prior to Visit  Medication Sig Dispense Refill  .  baclofen (LIORESAL) 10 MG tablet Take 40 to 60 mg's by mouth daily  90 each  1  . DULoxetine (CYMBALTA) 60 MG capsule Take 60 mg by mouth daily.      . metaxalone (SKELAXIN) 800 MG tablet Take 1 tablet (800 mg total) by mouth 3 (three) times daily.  270 tablet  1  . oxyCODONE-acetaminophen (PERCOCET) 10-325 MG per tablet Take 1 tablet by mouth every 4 (four) hours as needed.         No current facility-administered medications on file prior to visit.   The PMH, PSH, Social History, Family History, Medications, and [facility], and have been updated if relevant.  ROS: See HPI   Physical Exam BP 124/70  Pulse 75  Temp(Src) 97.8 F (36.6 C) (Oral)  Ht 5\' 5"  (1.651 m)  Wt 171 lb (77.565 kg)  BMI 28.46 kg/m2  SpO2 98%  General:  Well-developed,well-nourished,in no acute distress; alert,appropriate and cooperative throughout examination Head:  normocephalic and atraumatic.   Eyes:  vision grossly intact, pupils equal, pupils round, and pupils reactive to light.   Lungs:  Normal respiratory effort, chest expands symmetrically. Lungs are clear to auscultation, no crackles or wheezes. Heart:  Normal rate and regular rhythm. S1 and S2 normal without gallop, murmur, click, rub or other extra sounds. Abdomen:  Bowel sounds positive,abdomen soft and non-tender without masses, organomegaly or hernias noted. Msk:  No deformity or scoliosis noted of  thoracic or lumbar spine.   Extremities:  No clubbing, cyanosis, edema, or deformity noted with normal full range of motion of all joints.   Neurologic:  alert & oriented X3 and gait normal.   Skin:  Intact without suspicious lesions or rashes Psych:  Cognition and judgment appear intact. Alert and cooperative with normal attention span and concentration. No apparent delusions, illusions, hallucinations

## 2013-11-20 ENCOUNTER — Telehealth: Payer: Self-pay

## 2013-11-20 NOTE — Telephone Encounter (Signed)
Pt called to get results of recent labs; advised pt Dr Dayton MartesAron will discuss with pt at CPX. Pt said OK.

## 2013-12-01 ENCOUNTER — Telehealth: Payer: Self-pay

## 2013-12-01 NOTE — Telephone Encounter (Signed)
Relevant patient education assigned to patient using Emmi. ° °

## 2013-12-10 ENCOUNTER — Encounter: Payer: Self-pay | Admitting: Family Medicine

## 2013-12-10 ENCOUNTER — Ambulatory Visit (INDEPENDENT_AMBULATORY_CARE_PROVIDER_SITE_OTHER): Payer: Commercial Managed Care - PPO | Admitting: Family Medicine

## 2013-12-10 ENCOUNTER — Other Ambulatory Visit (HOSPITAL_COMMUNITY)
Admission: RE | Admit: 2013-12-10 | Discharge: 2013-12-10 | Disposition: A | Discharge status: Home / Self Care | Payer: Commercial Managed Care - PPO | Source: Ambulatory Visit | Attending: Family Medicine | Admitting: Family Medicine

## 2013-12-10 VITALS — BP 126/72 | HR 85 | Temp 97.9°F | Ht 66.25 in | Wt 170.5 lb

## 2013-12-10 DIAGNOSIS — K589 Irritable bowel syndrome without diarrhea: Secondary | ICD-10-CM

## 2013-12-10 DIAGNOSIS — E119 Type 2 diabetes mellitus without complications: Secondary | ICD-10-CM

## 2013-12-10 DIAGNOSIS — K5730 Diverticulosis of large intestine without perforation or abscess without bleeding: Secondary | ICD-10-CM

## 2013-12-10 DIAGNOSIS — E7800 Pure hypercholesterolemia, unspecified: Secondary | ICD-10-CM

## 2013-12-10 DIAGNOSIS — R635 Abnormal weight gain: Secondary | ICD-10-CM

## 2013-12-10 DIAGNOSIS — Z01419 Encounter for gynecological examination (general) (routine) without abnormal findings: Secondary | ICD-10-CM | POA: Insufficient documentation

## 2013-12-10 DIAGNOSIS — E039 Hypothyroidism, unspecified: Secondary | ICD-10-CM

## 2013-12-10 DIAGNOSIS — Z0000 Encounter for general adult medical examination without abnormal findings: Secondary | ICD-10-CM

## 2013-12-10 DIAGNOSIS — E0500 Thyrotoxicosis with diffuse goiter without thyrotoxic crisis or storm: Secondary | ICD-10-CM

## 2013-12-10 DIAGNOSIS — M9981 Other biomechanical lesions of cervical region: Secondary | ICD-10-CM

## 2013-12-10 LAB — HM MAMMOGRAPHY: HM Mammogram: NEGATIVE

## 2013-12-10 MED ORDER — PHENTERMINE HCL 15 MG PO CAPS: 15.0000 mg | ORAL_CAPSULE | ORAL | Status: DC

## 2013-12-10 NOTE — Patient Instructions (Signed)
Great to see you. We are starting phentermine 15 mg daily.  Please come seem in me in 1 month.

## 2013-12-10 NOTE — Addendum Note (Signed)
Addended by: Shon MilletWATLINGTON, Naijah Lacek M on: 12/10/2013 08:40 AM   Modules accepted: Orders

## 2013-12-10 NOTE — Assessment & Plan Note (Signed)
Reviewed preventive care protocols, scheduled due services, and updated immunizations Discussed nutrition, exercise, diet, and healthy lifestyle.  

## 2013-12-10 NOTE — Assessment & Plan Note (Signed)
Well controlled. No changes. 

## 2013-12-10 NOTE — Assessment & Plan Note (Signed)
Stable on current dose of synthroid. No changes. 

## 2013-12-10 NOTE — Assessment & Plan Note (Signed)
Diet controlled.  

## 2013-12-10 NOTE — Assessment & Plan Note (Signed)
BMI just above 27.  Discussed weight loss plan. Continue working with trainer and  with nutritionist  Pt would also like to discuss medication options- discussed phentermine risk benefits, side effects including HTN, pulmonary HTN, stroke.    She would like to start phentermine and lifestyle changes.  Follow up in 1 month.  If BMI < 27 will decrease to half dose x 1 month then stop

## 2013-12-10 NOTE — Assessment & Plan Note (Signed)
Pap smear today. Mammogram scheduled for later today.

## 2013-12-10 NOTE — Progress Notes (Signed)
Subjective:   Patient ID: Kelly Wyatt, female    DOB: 1960/09/07, 53 y.o.   MRN: [id number]  Kelly RenshawGeran R Wyatt is a pleasant 53 y.o. year old female who presents to clinic today with Annual Exam  on 12/10/2013  HPI:  [age] yo female with complicated medical history, including cervical somatic dysfunction here for CPX.   Having mammogram done today. Last mammogram 07/17/12. Last colonoscopy in 2012 S/p hysterectomy but does have cervix- last pap smear 04/10/11 (done by me). No h/o abnormal pap smears.   IBS- doing well on Linzess.  No longer having constipation.   HLD- on Niaspan Lab Results  Component Value Date   CHOL 268* 11/18/2013   HDL 122.20 11/18/2013   LDLCALC 129* 11/18/2013   LDLDIRECT 200.3 07/16/2012   TRIG 82.0 11/18/2013   CHOLHDL 2 11/18/2013   Obesity- really working on losing weight.  Has a nutritionist and trainer at her gym.  Wants a "boost."  Would like to discuss diet medication.  Patient Active Problem List   Diagnosis Date Noted  . Routine general medical examination at a health care facility 12/10/2013  . Weight gain 12/10/2013  . IBS (irritable bowel syndrome) 10/01/2012  . Diabetes mellitus 04/04/2011  . CERVICAL SOMATIC DYSFUNCTION 03/27/2010  . HYPERCHOLESTEROLEMIA 04/27/2008  . HYPOTHYROIDISM 01/02/2007  . 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 INTERNAL HEMORRHOIDS 09/06/2004  . DIVERTICULOSIS, COLON 09/06/2004  . GRAVES DISEASE 02/04/2003   Past Medical History  Diagnosis Date  . Diverticulosis of colon 09/06/2004   Past Surgical History  Procedure Laterality Date  . Abdominal hysterectomy      cervix left in   History  Substance Use Topics  . Smoking status: Never Smoker   . Smokeless tobacco: Not on file  . Alcohol Use: No   No family history on file. No Known Allergies Current Outpatient Prescriptions on File Prior to Visit  Medication Sig Dispense Refill  . baclofen (LIORESAL) 10 MG tablet Take 40 to 60 mg's by mouth daily  90  each  1  . diazepam (VALIUM) 2 MG tablet Take 5 mg by mouth daily.      . DULoxetine (CYMBALTA) 30 MG capsule Take 30 mg by mouth daily.      . DULoxetine (CYMBALTA) 60 MG capsule Take 60 mg by mouth daily.      Marland Kitchen. estrogens, conjugated, (PREMARIN) 0.9 MG tablet Take 1 tablet by mouth   daily for 21 days then do   not take for 7 days.  68 tablet  2  . levothyroxine (SYNTHROID) 125 MCG tablet Take 1 tablet by mouth  daily  90 tablet  1  . Linaclotide (LINZESS) 290 MCG CAPS capsule Take 1 capsule (290 mcg total) by mouth daily.  90 capsule  1  . metaxalone (SKELAXIN) 800 MG tablet Take 1 tablet (800 mg total) by mouth 3 (three) times daily.  270 tablet  1  . niacin (NIASPAN) 500 MG CR tablet Take 2 by mouth at bedtime.  180 tablet  3  . oxyCODONE-acetaminophen (PERCOCET) 10-325 MG per tablet Take 1 tablet by mouth every 4 (four) hours as needed.         No current facility-administered medications on file prior to visit.   The PMH, PSH, Social History, Family History, Medications, and allergies [facility], and have been updated if relevant.    Review of Systems See HPI Patient reports no  vision/ hearing changes,anorexia, weight change, fever ,adenopathy, persistant / recurrent  hoarseness, swallowing issues, chest pain, edema,persistant / recurrent cough, hemoptysis, dyspnea(rest, exertional, paroxysmal nocturnal), gastrointestinal  bleeding (melena, rectal bleeding), abdominal pain, excessive heart burn, GU symptoms(dysuria, hematuria, pyuria, voiding/incontinence  Issues) syncope, focal weakness, severe memory loss, concerning skin lesions, depression, anxiety, abnormal bruising/bleeding, major joint swelling, breast masses or abnormal vaginal bleeding.       Objective:    BP 126/72  Pulse 85  Temp(Src) 97.9 F (36.6 C) (Oral)  Ht 5' 6.25" (1.683 m)  Wt 170 lb 8 oz (77.338 kg)  BMI 27.30 kg/m2  SpO2 97%   Physical Exam  General:  Well-developed,well-nourished,in no  acute distress; alert,appropriate and cooperative throughout examination Head:  normocephalic and atraumatic.   Eyes:  vision grossly intact, pupils equal, pupils round, and pupils reactive to light.   Ears:  R ear normal and L ear normal.   Nose:  no external deformity.   Mouth:  good dentition.   Neck:  No deformities, masses, or tenderness noted. Breasts:  No mass, nodules, thickening, tenderness, bulging, retraction, inflamation, nipple discharge or skin changes noted.   Lungs:  Normal respiratory effort, chest expands symmetrically. Lungs are clear to auscultation, no crackles or wheezes. Heart:  Normal rate and regular rhythm. S1 and S2 normal without gallop, murmur, click, rub or other extra sounds. Abdomen:  Bowel sounds positive,abdomen soft and non-tender without masses, organomegaly or hernias noted. Rectal:  no external abnormalities.   Genitalia:  Pelvic Exam:        External: normal female genitalia without lesions or masses        Vagina: normal without lesions or masses        Cervix: normal without lesions or masses        Adnexa: normal bimanual exam without masses or fullness        Uterus: absent        Pap smear: performed Msk:  No deformity or scoliosis noted of thoracic or lumbar spine.   Extremities:  No clubbing, cyanosis, edema, or deformity noted with normal full range of motion of all joints.   Neurologic:  alert & oriented X3 and gait normal.   Skin:  Intact without suspicious lesions or rashes Cervical Nodes:  No lymphadenopathy noted Axillary Nodes:  No palpable lymphadenopathy Psych:  Cognition and judgment appear intact. Alert and cooperative with normal attention span and concentration. No apparent delusions, illusions, hallucinations        Assessment & Plan:   Routine general medical examination at a health care facility  CERVICAL SOMATIC DYSFUNCTION  Diabetes mellitus  GRAVES DISEASE  DIVERTICULOSIS,  COLON  HYPERCHOLESTEROLEMIA  HYPOTHYROIDISM  IBS (irritable bowel syndrome) No Follow-up on file.

## 2013-12-10 NOTE — Progress Notes (Signed)
Pre visit review using our clinic review tool, if applicable. No additional management support is needed unless otherwise documented below in the visit note. 

## 2013-12-14 ENCOUNTER — Encounter: Payer: Self-pay | Admitting: *Deleted

## 2013-12-18 ENCOUNTER — Telehealth: Payer: Self-pay | Admitting: *Deleted

## 2013-12-18 ENCOUNTER — Encounter: Payer: Self-pay | Admitting: Family Medicine

## 2013-12-18 NOTE — Telephone Encounter (Signed)
Spoke to pt who states that she was aware and has had additional images repeated.

## 2013-12-18 NOTE — Telephone Encounter (Signed)
Multiple attempts made to contact pt.Records received indicating additional views required for mammogram. Contacting pt to confirm she is aware these views are required. Lm on pts vm requesting a call back

## 2014-01-12 ENCOUNTER — Ambulatory Visit (INDEPENDENT_AMBULATORY_CARE_PROVIDER_SITE_OTHER): Payer: Commercial Managed Care - PPO | Admitting: Family Medicine

## 2014-01-12 ENCOUNTER — Encounter: Payer: Self-pay | Admitting: Family Medicine

## 2014-01-12 VITALS — BP 122/70 | HR 105 | Temp 98.0°F | Wt 166.5 lb

## 2014-01-12 DIAGNOSIS — Z2089 Contact with and (suspected) exposure to other communicable diseases: Secondary | ICD-10-CM

## 2014-01-12 DIAGNOSIS — R635 Abnormal weight gain: Secondary | ICD-10-CM

## 2014-01-12 MED ORDER — PHENTERMINE HCL 30 MG PO CAPS: 30.0000 mg | ORAL_CAPSULE | ORAL | Status: DC

## 2014-01-12 NOTE — Patient Instructions (Signed)
Great to see you. We are trying increased dose of phentermine 30 mg daily. Follow up in 1 month.

## 2014-01-12 NOTE — Progress Notes (Signed)
Subjective:   Patient ID: Kelly Wyatt, female    DOB: 1960-10-30, 53 y.o.   MRN: [id number]  Kelly RenshawGeran R Wyatt is a pleasant 53 y.o. year old female who presents to clinic today with Follow-up  on 01/12/2014  HPI: Obesity- Continues to work with nutritionist and Systems analystpersonal trainer. Started phentermine 15 mg daily last month.  Has lost 5 pounds. Denies any CP, SOB, palpitations or LE edema.  Wt Readings from Last 3 Encounters:  01/12/14 166 lb 8 oz (75.524 kg)  12/10/13 170 lb 8 oz (77.338 kg)  11/18/13 171 lb (77.565 kg)   Current Outpatient Prescriptions on File Prior to Visit  Medication Sig Dispense Refill  . baclofen (LIORESAL) 10 MG tablet Take 40 to 60 mg's by mouth daily  90 each  1  . diazepam (VALIUM) 2 MG tablet Take 5 mg by mouth daily.      . DULoxetine (CYMBALTA) 30 MG capsule Take 30 mg by mouth daily.      . DULoxetine (CYMBALTA) 60 MG capsule Take 60 mg by mouth daily.      Marland Kitchen. estrogens, conjugated, (PREMARIN) 0.9 MG tablet Take 1 tablet by mouth   daily for 21 days then do   not take for 7 days.  68 tablet  2  . levothyroxine (SYNTHROID) 125 MCG tablet Take 1 tablet by mouth  daily  90 tablet  1  . Linaclotide (LINZESS) 290 MCG CAPS capsule Take 1 capsule (290 mcg total) by mouth daily.  90 capsule  1  . metaxalone (SKELAXIN) 800 MG tablet Take 1 tablet (800 mg total) by mouth 3 (three) times daily.  270 tablet  1  . niacin (NIASPAN) 500 MG CR tablet Take 2 by mouth at bedtime.  180 tablet  3  . oxyCODONE-acetaminophen (PERCOCET) 10-325 MG per tablet Take 1 tablet by mouth every 4 (four) hours as needed.        . phentermine 15 MG capsule Take 1 capsule (15 mg total) by mouth every morning.  30 capsule  0   No current facility-administered medications on file prior to visit.    No Known Allergies  Past Medical History  Diagnosis Date  . Diverticulosis of colon 09/06/2004    Past Surgical History  Procedure Laterality Date  . Abdominal hysterectomy     cervix left in    No family history on file.  History   Social History  . Marital Status: Single    Spouse Name: N/A    Number of Children: 0  . Years of Education: N/A   Occupational History  . Not on file.   Social History Main Topics  . Smoking status: Never Smoker   . Smokeless tobacco: Not on file  . Alcohol Use: No  . Drug Use: No  . Sexual Activity: Not on file   Other Topics Concern  . Not on file   Social History Narrative  . No narrative on file   The PMH, PSH, Social History, Family History, Medications, and allergies have been [facility], and have been updated if relevant.    Review of Systems See HPI    Objective:    BP 122/70  Pulse 105  Temp(Src) 98 F (36.7 C) (Oral)  Wt 166 lb 8 oz (75.524 kg)  SpO2 97%   Physical Exam  Nursing note and vitals reviewed. Constitutional: She appears well-developed and well-nourished. No distress.  Cardiovascular: Normal rate and regular rhythm.   Pulmonary/Chest: Effort normal and  breath sounds normal.  Skin: Skin is warm and dry.  Psychiatric: She has a normal mood and affect. Her behavior is normal. Judgment and thought content normal.          Assessment & Plan:   Weight gain No Follow-up on file.

## 2014-01-12 NOTE — Addendum Note (Signed)
Addended by: Alvina Chou on: 01/12/2014 01:50 PM   Modules accepted: Orders

## 2014-01-12 NOTE — Assessment & Plan Note (Signed)
Improved but she would like to increase dose of phentermine 30 mg daily. Follow up in 1 month.

## 2014-01-13 LAB — RUBELLA SCREEN: Rubella: 13.9 Index — ABNORMAL HIGH (ref <0.90)

## 2014-01-13 LAB — RUBEOLA ANTIBODY IGG: Rubeola IgG: 11.5 AU/mL (ref <25.00)

## 2014-01-13 LAB — HEPATITIS B SURFACE ANTIBODY, QUANTITATIVE: Hepatitis B-Post: 52.4 m[IU]/mL

## 2014-01-13 LAB — MUMPS ANTIBODY, IGG: Mumps IgG: 297 AU/mL — ABNORMAL HIGH (ref <9.00)

## 2014-01-13 LAB — HEPATITIS A ANTIBODY, TOTAL: HEP A TOTAL AB: NONREACTIVE

## 2014-01-13 LAB — HEPATITIS A ANTIBODY, IGM: HEP A IGM: NONREACTIVE

## 2014-02-10 ENCOUNTER — Ambulatory Visit: Payer: Commercial Managed Care - PPO | Admitting: Family Medicine

## 2014-02-16 ENCOUNTER — Ambulatory Visit (INDEPENDENT_AMBULATORY_CARE_PROVIDER_SITE_OTHER): Payer: Commercial Managed Care - PPO | Admitting: Family Medicine

## 2014-02-16 ENCOUNTER — Encounter: Payer: Self-pay | Admitting: Family Medicine

## 2014-02-16 VITALS — BP 128/70 | HR 72 | Temp 97.8°F | Wt 169.8 lb

## 2014-02-16 DIAGNOSIS — R635 Abnormal weight gain: Secondary | ICD-10-CM

## 2014-02-16 DIAGNOSIS — Z23 Encounter for immunization: Secondary | ICD-10-CM

## 2014-02-16 MED ORDER — PHENTERMINE HCL 30 MG PO CAPS: 30.0000 mg | ORAL_CAPSULE | ORAL | Status: DC

## 2014-02-16 NOTE — Addendum Note (Signed)
Addended by: Desmond DikeKNIGHT, Jojo Pehl H on: 02/16/2014 09:40 AM   Modules accepted: Orders

## 2014-02-16 NOTE — Progress Notes (Signed)
Pre visit review using our clinic review tool, if applicable. No additional management support is needed unless otherwise documented below in the visit note. 

## 2014-02-16 NOTE — Assessment & Plan Note (Signed)
Deteriorated but under increased stressors. Rx for phentermine refilled.  She is motivated to restart exercise and dietary changes. Follow up in 1 month. The patient indicates understanding of these issues and agrees with the plan.

## 2014-02-16 NOTE — Progress Notes (Signed)
Subjective:   Patient ID: Kelly Wyatt, female    DOB: 1961/04/07, 53 y.o.   MRN: [id number]  Kelly Wyatt is a pleasant 53 y.o. year old female who presents to clinic today with Follow-up and Immunizations  on 02/16/2014  HPI: Obesity- Continues to work with nutritionist and Physiological scientist. Started phentermine 15 mg daily two months ago and we increased her dosage to 30 mg daily last month.   Denies any CP, SOB, palpitations or LE edema.  Actually gained a few pounds this month- her mom has been very sick with liver failure so she has not been exercising and or eating right. Leaves for [place] next week for work. Needs HepA and MMR vaccinations today.  Wt Readings from Last 3 Encounters:  02/16/14 169 lb 12 oz (76.998 kg)  01/12/14 166 lb 8 oz (75.524 kg)  12/10/13 170 lb 8 oz (77.338 kg)   Current Outpatient Prescriptions on File Prior to Visit  Medication Sig Dispense Refill  . baclofen (LIORESAL) 10 MG tablet Take 40 to 60 mg's by mouth daily  90 each  1  . diazepam (VALIUM) 2 MG tablet Take 5 mg by mouth daily.      . DULoxetine (CYMBALTA) 30 MG capsule Take 30 mg by mouth daily.      . DULoxetine (CYMBALTA) 60 MG capsule Take 60 mg by mouth daily.      Marland Kitchen estrogens, conjugated, (PREMARIN) 0.9 MG tablet Take 1 tablet by mouth   daily for 21 days then do   not take for 7 days.  68 tablet  2  . levothyroxine (SYNTHROID) 125 MCG tablet Take 1 tablet by mouth  daily  90 tablet  1  . Linaclotide (LINZESS) 290 MCG CAPS capsule Take 1 capsule (290 mcg total) by mouth daily.  90 capsule  1  . metaxalone (SKELAXIN) 800 MG tablet Take 1 tablet (800 mg total) by mouth 3 (three) times daily.  270 tablet  1  . niacin (NIASPAN) 500 MG CR tablet Take 2 by mouth at bedtime.  180 tablet  3  . oxyCODONE-acetaminophen (PERCOCET) 10-325 MG per tablet Take 1 tablet by mouth every 4 (four) hours as needed.         No current facility-administered medications on file prior to visit.    No  Known Allergies  Past Medical History  Diagnosis Date  . Diverticulosis of colon 09/06/2004    Past Surgical History  Procedure Laterality Date  . Abdominal hysterectomy      cervix left in    Family History  Problem Relation Age of Onset  . Factor V Leiden deficiency Mother     History   Social History  . Marital Status: Single    Spouse Name: N/A    Number of Children: 0  . Years of Education: N/A   Occupational History  . Not on file.   Social History Main Topics  . Smoking status: Former Research scientist (life sciences)  . Smokeless tobacco: Not on file  . Alcohol Use: No  . Drug Use: No  . Sexual Activity: Not on file   Other Topics Concern  . Not on file   Social History Narrative  . No narrative on file   The PMH, PSH, Social History, Family History, Medications, and allergies have been reviewed in [facility], and have been updated if relevant.    Review of Systems See HPI    Objective:    BP 128/70  Pulse 72  Temp(Src) 97.8  F (36.6 C) (Oral)  Wt 169 lb 12 oz (76.998 kg)  SpO2 98%   Physical Exam  Nursing note and vitals reviewed. Constitutional: She appears well-developed and well-nourished. No distress.  Cardiovascular: Normal rate and regular rhythm.   Pulmonary/Chest: Effort normal and breath sounds normal.  Skin: Skin is warm and dry.  Psychiatric: She has a normal mood and affect. Her behavior is normal. Judgment and thought content normal.          Assessment & Plan:   Weight gain No Follow-up on file.

## 2014-03-24 ENCOUNTER — Encounter: Payer: Self-pay | Admitting: Family Medicine

## 2014-03-24 ENCOUNTER — Ambulatory Visit (INDEPENDENT_AMBULATORY_CARE_PROVIDER_SITE_OTHER): Payer: Commercial Managed Care - PPO | Admitting: Family Medicine

## 2014-03-24 VITALS — BP 114/72 | HR 83 | Temp 97.9°F | Wt 167.8 lb

## 2014-03-24 DIAGNOSIS — R635 Abnormal weight gain: Secondary | ICD-10-CM

## 2014-03-24 MED ORDER — PHENTERMINE HCL 37.5 MG PO CAPS: 37.5000 mg | ORAL_CAPSULE | ORAL | Status: DC

## 2014-03-24 NOTE — Assessment & Plan Note (Signed)
Improved. She would like to try increased dose. Rx given for phentermine 37.5 mg daily. Follow up in 2 months.

## 2014-03-24 NOTE — Progress Notes (Signed)
Pre visit review using our clinic review tool, if applicable. No additional management support is needed unless otherwise documented below in the visit note. 

## 2014-03-24 NOTE — Progress Notes (Signed)
Subjective:   Patient ID: Kelly Wyatt, female    DOB: 02-02-1961, 53 y.o.   MRN: [id number]  Kelly Wyatt is a pleasant 53 y.o. year old female who presents to clinic today with Follow-up  on 03/24/2014  HPI: Obesity- Continues to work with nutritionist and Systems analystpersonal trainer. On Phentermine 30 mg daily.  Has not been exercising as much due to her headaches.   Denies any CP, SOB, palpitations or LE edema.  [place] from [place]  Had a good trip.  Wt Readings from Last 3 Encounters:  03/24/14 167 lb 12 oz (76.091 kg)  02/16/14 169 lb 12 oz (76.998 kg)  01/12/14 166 lb 8 oz (75.524 kg)   Current Outpatient Prescriptions on File Prior to Visit  Medication Sig Dispense Refill  . baclofen (LIORESAL) 10 MG tablet Take 40 to 60 mg's by mouth daily  90 each  1  . diazepam (VALIUM) 2 MG tablet Take 5 mg by mouth daily.      . DULoxetine (CYMBALTA) 30 MG capsule Take 30 mg by mouth daily.      . DULoxetine (CYMBALTA) 60 MG capsule Take 60 mg by mouth daily.      Marland Kitchen. estrogens, conjugated, (PREMARIN) 0.9 MG tablet Take 1 tablet by mouth   daily for 21 days then do   not take for 7 days.  68 tablet  2  . levothyroxine (SYNTHROID) 125 MCG tablet Take 1 tablet by mouth  daily  90 tablet  1  . Linaclotide (LINZESS) 290 MCG CAPS capsule Take 1 capsule (290 mcg total) by mouth daily.  90 capsule  1  . metaxalone (SKELAXIN) 800 MG tablet Take 1 tablet (800 mg total) by mouth 3 (three) times daily.  270 tablet  1  . niacin (NIASPAN) 500 MG CR tablet Take 2 by mouth at bedtime.  180 tablet  3  . oxyCODONE-acetaminophen (PERCOCET) 10-325 MG per tablet Take 1 tablet by mouth every 4 (four) hours as needed.        . phentermine 30 MG capsule Take 1 capsule (30 mg total) by mouth every morning.  30 capsule  0   No current facility-administered medications on file prior to visit.    No Known Allergies  Past Medical History  Diagnosis Date  . Diverticulosis of colon 09/06/2004    Past  Surgical History  Procedure Laterality Date  . Abdominal hysterectomy      cervix left in    Family History  Problem Relation Age of Onset  . Factor V Leiden deficiency Mother     History   Social History  . Marital Status: Single    Spouse Name: N/A    Number of Children: 0  . Years of Education: N/A   Occupational History  . Not on file.   Social History Main Topics  . Smoking status: Former Games developermoker  . Smokeless tobacco: Not on file  . Alcohol Use: No  . Drug Use: No  . Sexual Activity: Not on file   Other Topics Concern  . Not on file   Social History Narrative  . No narrative on file   The PMH, PSH, Social History, Family History, Medications, and allergies have been [facility], and have been updated if relevant.    Review of Systems See HPI    Objective:    BP 114/72  Pulse 83  Temp(Src) 97.9 F (36.6 C) (Oral)  Wt 167 lb 12 oz (76.091 kg)  SpO2  98%   Physical Exam  Nursing note and vitals reviewed. Constitutional: She appears well-developed and well-nourished. No distress.  Cardiovascular: Normal rate and regular rhythm.   Pulmonary/Chest: Effort normal and breath sounds normal.  Skin: Skin is warm and dry.  Psychiatric: She has a normal mood and affect. Her behavior is normal. Judgment and thought content normal.          Assessment & Plan:   Weight gain No Follow-up on file.

## 2014-03-29 ENCOUNTER — Other Ambulatory Visit: Payer: Self-pay | Admitting: Family Medicine

## 2014-04-19 ENCOUNTER — Other Ambulatory Visit: Payer: Self-pay | Admitting: Psychiatry

## 2014-04-19 DIAGNOSIS — M47812 Spondylosis without myelopathy or radiculopathy, cervical region: Secondary | ICD-10-CM

## 2014-04-19 DIAGNOSIS — G249 Dystonia, unspecified: Secondary | ICD-10-CM

## 2014-04-23 ENCOUNTER — Ambulatory Visit
Admission: RE | Admit: 2014-04-23 | Discharge: 2014-04-23 | Disposition: A | Discharge status: Home / Self Care | Payer: Commercial Managed Care - PPO | Source: Ambulatory Visit | Attending: Psychiatry | Admitting: Psychiatry

## 2014-04-23 DIAGNOSIS — M47812 Spondylosis without myelopathy or radiculopathy, cervical region: Secondary | ICD-10-CM

## 2014-04-23 DIAGNOSIS — G249 Dystonia, unspecified: Secondary | ICD-10-CM

## 2014-04-24 ENCOUNTER — Other Ambulatory Visit: Payer: Self-pay | Admitting: Family Medicine

## 2014-05-05 ENCOUNTER — Ambulatory Visit (INDEPENDENT_AMBULATORY_CARE_PROVIDER_SITE_OTHER): Payer: Commercial Managed Care - PPO | Admitting: Family Medicine

## 2014-05-05 ENCOUNTER — Encounter: Payer: Self-pay | Admitting: Family Medicine

## 2014-05-05 VITALS — BP 118/70 | HR 98 | Temp 97.6°F | Wt 169.5 lb

## 2014-05-05 DIAGNOSIS — R635 Abnormal weight gain: Secondary | ICD-10-CM

## 2014-05-05 DIAGNOSIS — R059 Cough, unspecified: Secondary | ICD-10-CM

## 2014-05-05 DIAGNOSIS — R05 Cough: Secondary | ICD-10-CM

## 2014-05-05 MED ORDER — PHENTERMINE HCL 37.5 MG PO CAPS: 37.5000 mg | ORAL_CAPSULE | ORAL | Status: DC

## 2014-05-05 MED ORDER — ALBUTEROL SULFATE HFA 108 (90 BASE) MCG/ACT IN AERS: 2.0000 | INHALATION_SPRAY | Freq: Four times a day (QID) | RESPIRATORY_TRACT | Status: DC | PRN

## 2014-05-05 NOTE — Progress Notes (Signed)
Subjective:   Patient ID: Kelly Wyatt, female    DOB: Aug 22, 1960, 53 y.o.   MRN: [id number]  Kelly Wyatt is a pleasant 53 y.o. year old female who presents to clinic today with congestion in chest  on 05/05/2014  HPI: Congestion in chest- started yesterday. Sister has similar symptoms.  Had sore throat and cough yesterday.  Last night, voice was hoarse.  Cough is "not bad."  Not productive. Not taking anything for it yet.  No ear pain. No CP or SOB. Former smoker.  Obesity- having a tough time with her neck pain- just had more botox injections.  Has not been to the gym regularly although she wants to because she knows it will help with her pain.   Asking for refill of phentermine.  Never had any CP, SOB or palpitations with it. Feels she is snacking too much. Wt Readings from Last 3 Encounters:  05/05/14 169 lb 8 oz (76.885 kg)  03/24/14 167 lb 12 oz (76.091 kg)  02/16/14 169 lb 12 oz (76.998 kg)   Current Outpatient Prescriptions on File Prior to Visit  Medication Sig Dispense Refill  . baclofen (LIORESAL) 10 MG tablet Take 40 to 60 mg's by mouth daily  90 each  1  . diazepam (VALIUM) 2 MG tablet Take 5 mg by mouth daily.      . DULoxetine (CYMBALTA) 30 MG capsule Take 30 mg by mouth daily.      . DULoxetine (CYMBALTA) 60 MG capsule Take 60 mg by mouth daily.      Marland Kitchen estrogens, conjugated, (PREMARIN) 0.9 MG tablet Take 1 tablet by mouth   daily for 21 days then do   not take for 7 days.  68 tablet  2  . LINZESS 290 MCG CAPS capsule Take 1 capsule by mouth  daily  90 capsule  0  . metaxalone (SKELAXIN) 800 MG tablet Take 1 tablet (800 mg total) by mouth 3 (three) times daily.  270 tablet  1  . niacin (NIASPAN) 500 MG CR tablet Take 2 by mouth at bedtime.  180 tablet  3  . oxyCODONE-acetaminophen (PERCOCET) 10-325 MG per tablet Take 1 tablet by mouth every 4 (four) hours as needed.        Marland Kitchen SYNTHROID 125 MCG tablet Take 1 tablet by mouth  daily  90 tablet  1   No current  facility-administered medications on file prior to visit.    No Known Allergies  Past Medical History  Diagnosis Date  . Diverticulosis of colon 09/06/2004    Past Surgical History  Procedure Laterality Date  . Abdominal hysterectomy      cervix left in    Family History  Problem Relation Age of Onset  . Factor V Leiden deficiency Mother     History   Social History  . Marital Status: Single    Spouse Name: N/A    Number of Children: 0  . Years of Education: N/A   Occupational History  . Not on file.   Social History Main Topics  . Smoking status: Former Games developer  . Smokeless tobacco: Not on file  . Alcohol Use: No  . Drug Use: No  . Sexual Activity: Not on file   Other Topics Concern  . Not on file   Social History Narrative  . No narrative on file   The PMH, PSH, Social History, Family History, Medications, and allergies have been reviewed in [facility], and have been updated if relevant.  Review of Systems See HPI No fevers No nausea or vomiting No abdominal pain     Objective:    BP 118/70  Pulse 98  Temp(Src) 97.6 F (36.4 C) (Oral)  Wt 169 lb 8 oz (76.885 kg)  SpO2 98%   Physical Exam  Nursing note and vitals reviewed. Constitutional: She appears well-developed and well-nourished. No distress.  HENT:  Head: Normocephalic.  Right Ear: Tympanic membrane normal.  Left Ear: Tympanic membrane normal.  Nose: Rhinorrhea present. No mucosal edema. Right sinus exhibits no maxillary sinus tenderness and no frontal sinus tenderness. Left sinus exhibits no maxillary sinus tenderness and no frontal sinus tenderness.  Mouth/Throat: Mucous membranes are normal. Posterior oropharyngeal erythema present. No oropharyngeal exudate.  Cardiovascular: Normal rate, regular rhythm and normal heart sounds.   Pulmonary/Chest: Effort normal. She has no decreased breath sounds. She has wheezes in the right lower field. She has no rhonchi. She has no rales.  Psychiatric:  She has a normal mood and affect. Her speech is normal and behavior is normal. Judgment and thought content normal. Cognition and memory are normal.          Assessment & Plan:   Weight gain  Cough No Follow-up on file.

## 2014-05-05 NOTE — Patient Instructions (Signed)
Good to see you.   Drink lots of fluids.   Treat sympotmatically with Mucinex, nasal saline irrigation, and Tylenol/Ibuprofen.  Proair inhaler as needed.   Call if not improving as expected in 5-7 days.

## 2014-05-05 NOTE — Assessment & Plan Note (Signed)
New- does have wheezes on exam. Probable viral bronchitis. Start prn albuterol inhaler.  Advised mucinex for a short period of time (with increased fluids). Call or return to clinic prn if these symptoms worsen or fail to improve as anticipated. The patient indicates understanding of these issues and agrees with the plan.

## 2014-05-05 NOTE — Assessment & Plan Note (Signed)
Deteriorated. Fill one time only as she is close to BMI of 27. Advised increased physical activity when she feels better. The patient indicates understanding of these issues and agrees with the plan. >25 minutes spent in face to face time with patient, >50% spent in counselling or coordination of care

## 2014-05-05 NOTE — Progress Notes (Signed)
Pre visit review using our clinic review tool, if applicable. No additional management support is needed unless otherwise documented below in the visit note. 

## 2014-05-21 ENCOUNTER — Other Ambulatory Visit: Payer: Self-pay

## 2014-07-20 ENCOUNTER — Other Ambulatory Visit: Payer: Self-pay

## 2014-07-20 NOTE — Telephone Encounter (Signed)
Pt request refill phentermine rx. Pt was seen 05/05/14 and noted one time fill due to pts BMI; pt said Dr Dayton MartesAron told her she could call and get refill of phentermine. Pt does not want to schedule appt but does want request sent to Dr Dayton MartesAron. Pt request cb when rx ready for pick up.Please advise.

## 2014-07-20 NOTE — Telephone Encounter (Signed)
Yes I was ok with her receiving refills-Ok to refill one time only but we do need her to follow up over next couple of months to make sure her BMI is appropriate and vital signs still stable.

## 2014-07-22 MED ORDER — PHENTERMINE HCL 37.5 MG PO CAPS: 37.5000 mg | ORAL_CAPSULE | ORAL | Status: DC

## 2014-07-22 NOTE — Telephone Encounter (Signed)
Rx called in to requested pharmacy; pt advised OV required for additional refills and f/u appt scheduled.

## 2014-07-23 ENCOUNTER — Other Ambulatory Visit: Payer: Self-pay | Admitting: Family Medicine

## 2014-08-24 ENCOUNTER — Encounter: Payer: Self-pay | Admitting: Internal Medicine

## 2014-08-25 ENCOUNTER — Ambulatory Visit (INDEPENDENT_AMBULATORY_CARE_PROVIDER_SITE_OTHER): Payer: Commercial Managed Care - PPO | Admitting: Family Medicine

## 2014-08-25 ENCOUNTER — Encounter: Payer: Self-pay | Admitting: Family Medicine

## 2014-08-25 VITALS — BP 110/70 | HR 88 | Temp 97.5°F | Ht 66.0 in | Wt 169.0 lb

## 2014-08-25 DIAGNOSIS — R635 Abnormal weight gain: Secondary | ICD-10-CM

## 2014-08-25 MED ORDER — PHENTERMINE HCL 37.5 MG PO CAPS: 37.5000 mg | ORAL_CAPSULE | ORAL | Status: DC

## 2014-08-25 NOTE — Assessment & Plan Note (Signed)
She feels phentermine is working well at current dose. She is aware of side effects-including HTN, pulmonary HTN, stroke.    She would like to start phentermine and lifestyle changes.  If BMI < 27 will decrease to half dose x 1 month then stop.  The patient indicates understanding of these issues and agrees with the plan.

## 2014-08-25 NOTE — Patient Instructions (Signed)
Great to see you. Come see me in a couple of months.

## 2014-08-25 NOTE — Progress Notes (Signed)
Pre visit review using our clinic review tool, if applicable. No additional management support is needed unless otherwise documented below in the visit note. 

## 2014-08-25 NOTE — Progress Notes (Signed)
Subjective:   Patient ID: Lisbeth RenshawGeran R Longie, female    DOB: July 23, 1961, 54 y.o.   MRN: [id number]  Lisbeth RenshawGeran R Hohensee is a pleasant 54 y.o. year old female who presents to clinic today with Follow-up  on 08/25/2014  HPI: Feels phentermine is working well.  She has not lost pounds but has lost "inches.: "i am wearing jeans I have not worn since high school."  Also she is pleased that phentermine is not worsening her headaches.  Wt Readings from Last 3 Encounters:  08/25/14 169 lb (76.658 kg)  05/05/14 169 lb 8 oz (76.885 kg)  03/24/14 167 lb 12 oz (76.091 kg)   Not having any chest pain, palpations, SOB, or insomnia.  Current Outpatient Prescriptions on File Prior to Visit  Medication Sig Dispense Refill  . albuterol (PROVENTIL HFA;VENTOLIN HFA) 108 (90 BASE) MCG/ACT inhaler Inhale 2 puffs into the lungs every 6 (six) hours as needed. 1 Inhaler 0  . baclofen (LIORESAL) 10 MG tablet Take 40 to 60 mg's by mouth daily (Patient taking differently: Take110 mg by mouth daily) 90 each 1  . diazepam (VALIUM) 2 MG tablet Take 5 mg by mouth daily.    . DULoxetine (CYMBALTA) 30 MG capsule Take 30 mg by mouth daily.    . DULoxetine (CYMBALTA) 60 MG capsule Take 60 mg by mouth daily.    Marland Kitchen. estrogens, conjugated, (PREMARIN) 0.9 MG tablet Take 1 tablet by mouth   daily for 21 days then do   not take for 7 days. 68 tablet 2  . LINZESS 290 MCG CAPS capsule Take 1 capsule by mouth  daily 90 capsule 0  . niacin (NIASPAN) 500 MG CR tablet Take 2 by mouth at bedtime. 180 tablet 3  . oxyCODONE-acetaminophen (PERCOCET) 10-325 MG per tablet Take 1 tablet by mouth every 4 (four) hours as needed.      Marland Kitchen. SYNTHROID 125 MCG tablet Take 1 tablet by mouth  daily 90 tablet 1   No current facility-administered medications on file prior to visit.    No Known Allergies  Past Medical History  Diagnosis Date  . Diverticulosis of colon 09/06/2004    Past Surgical History  Procedure Laterality Date  . Abdominal  hysterectomy      cervix left in    Family History  Problem Relation Age of Onset  . Factor V Leiden deficiency Mother     History   Social History  . Marital Status: Single    Spouse Name: N/A    Number of Children: 0  . Years of Education: N/A   Occupational History  . Not on file.   Social History Main Topics  . Smoking status: Former Games developermoker  . Smokeless tobacco: Not on file  . Alcohol Use: No  . Drug Use: No  . Sexual Activity: Not on file   Other Topics Concern  . Not on file   Social History Narrative   The PMH, PSH, Social History, Family History, Medications, and [facility], and have been updated if relevant.  Review of Systems  Constitutional: Negative.   Respiratory: Negative.   Cardiovascular: Negative.   Musculoskeletal: Negative.   Skin: Negative.   Hematological: Negative.   Psychiatric/Behavioral: Negative.   All other systems reviewed and are negative.      Objective:    BP 110/70 mmHg  Pulse 88  Temp(Src) 97.5 F (36.4 C) (Tympanic)  Ht 5\' 6"  (1.676 m)  Wt 169 lb (76.658 kg)  BMI 27.29 kg/m2  SpO2 97%   Physical Exam  Constitutional: She is oriented to person, place, and time. She appears well-developed and well-nourished. No distress.  HENT:  Head: Normocephalic.  Cardiovascular: Normal rate.   Pulmonary/Chest: Effort normal. No respiratory distress.  Musculoskeletal: Normal range of motion.  Neurological: She is alert and oriented to person, place, and time.  Skin: Skin is warm and dry.  Psychiatric: She has a normal mood and affect. Her behavior is normal. Judgment and thought content normal.          Assessment & Plan:   Weight gain No Follow-up on file.

## 2014-09-07 ENCOUNTER — Other Ambulatory Visit: Payer: Self-pay | Admitting: Family Medicine

## 2014-10-21 ENCOUNTER — Other Ambulatory Visit: Payer: Self-pay | Admitting: Family Medicine

## 2014-11-18 ENCOUNTER — Other Ambulatory Visit: Payer: Self-pay | Admitting: Family Medicine

## 2014-12-06 ENCOUNTER — Other Ambulatory Visit: Payer: Self-pay | Admitting: Family Medicine

## 2014-12-14 ENCOUNTER — Encounter: Payer: Self-pay | Admitting: Family Medicine

## 2015-01-12 ENCOUNTER — Telehealth: Payer: Self-pay | Admitting: *Deleted

## 2015-01-12 NOTE — Telephone Encounter (Signed)
Good afternoon. I wanted to make Dr Dayton MartesAron aware of some appointments I've had. I was told by each Dr.'s office that she would have to request these records. On 11-25-14 I [facility] due to right side tender, totally different bowel changes and fatigue going on about 2 months. She did blood test all ok but liver enzymes were elevated. (I think [id number])  She also ordered an exray of intestines and an abdominal scan which included kidneys and chest X-ray. The X-ray came back normal and the scan also I believe. She ordered additional blood work and stool sample which I did on Monday 12-13-14. So I'm waiting results. [facility]. # is [phone]). Today I had a radio frequency ablation at [facility] @ [phone] under Dr. Mathis DadScott Runyon. He performed the ablation on C-2,C-3,C-4,C-5 hopefully to reduce terrible headaches. You have been so kind and I feel you are my advocate so I wanted you to know. Thanks. Kelly CapersGeran Wyatt 04/13/2061

## 2015-01-25 ENCOUNTER — Encounter: Payer: Self-pay | Admitting: Family Medicine

## 2015-01-26 ENCOUNTER — Other Ambulatory Visit (HOSPITAL_COMMUNITY)
Admission: RE | Admit: 2015-01-26 | Discharge: 2015-01-26 | Disposition: A | Discharge status: Home / Self Care | Payer: Commercial Managed Care - PPO | Source: Ambulatory Visit | Attending: Family Medicine | Admitting: Family Medicine

## 2015-01-26 ENCOUNTER — Ambulatory Visit (INDEPENDENT_AMBULATORY_CARE_PROVIDER_SITE_OTHER): Payer: Commercial Managed Care - PPO | Admitting: Family Medicine

## 2015-01-26 ENCOUNTER — Encounter: Payer: Self-pay | Admitting: Family Medicine

## 2015-01-26 VITALS — BP 118/66 | HR 83 | Temp 97.4°F | Ht 66.0 in | Wt 170.5 lb

## 2015-01-26 DIAGNOSIS — E119 Type 2 diabetes mellitus without complications: Secondary | ICD-10-CM

## 2015-01-26 DIAGNOSIS — K589 Irritable bowel syndrome without diarrhea: Secondary | ICD-10-CM

## 2015-01-26 DIAGNOSIS — Z1151 Encounter for screening for human papillomavirus (HPV): Secondary | ICD-10-CM | POA: Diagnosis present

## 2015-01-26 DIAGNOSIS — R5383 Other fatigue: Secondary | ICD-10-CM | POA: Diagnosis not present

## 2015-01-26 DIAGNOSIS — E0500 Thyrotoxicosis with diffuse goiter without thyrotoxic crisis or storm: Secondary | ICD-10-CM

## 2015-01-26 DIAGNOSIS — Z0000 Encounter for general adult medical examination without abnormal findings: Secondary | ICD-10-CM | POA: Diagnosis not present

## 2015-01-26 DIAGNOSIS — E038 Other specified hypothyroidism: Secondary | ICD-10-CM | POA: Diagnosis not present

## 2015-01-26 DIAGNOSIS — Z01419 Encounter for gynecological examination (general) (routine) without abnormal findings: Secondary | ICD-10-CM | POA: Diagnosis not present

## 2015-01-26 DIAGNOSIS — Z79890 Hormone replacement therapy (postmenopausal): Secondary | ICD-10-CM

## 2015-01-26 LAB — COMPREHENSIVE METABOLIC PANEL
ALK PHOS: 56 U/L (ref 39–117)
ALT: 19 U/L (ref 0–35)
AST: 23 U/L (ref 0–37)
Albumin: 4 g/dL (ref 3.5–5.2)
BILIRUBIN TOTAL: 0.4 mg/dL (ref 0.2–1.2)
BUN: 14 mg/dL (ref 6–23)
CO2: 29 meq/L (ref 19–32)
Calcium: 9.4 mg/dL (ref 8.4–10.5)
Chloride: 103 mEq/L (ref 96–112)
Creatinine, Ser: 0.72 mg/dL (ref 0.40–1.20)
GFR: 89.71 mL/min (ref >60.00)
Glucose, Bld: 102 mg/dL — ABNORMAL HIGH (ref 70–99)
Potassium: 3.9 mEq/L (ref 3.5–5.1)
SODIUM: 139 meq/L (ref 135–145)
Total Protein: 7.5 g/dL (ref 6.0–8.3)

## 2015-01-26 LAB — LIPID PANEL
CHOLESTEROL: 266 mg/dL — AB (ref 0–200)
HDL: 96.6 mg/dL (ref >39.00)
LDL Cholesterol: 143 mg/dL — ABNORMAL HIGH (ref 0–99)
NonHDL: 169.4
Total CHOL/HDL Ratio: 3
Triglycerides: 130 mg/dL (ref 0.0–149.0)
VLDL: 26 mg/dL (ref 0.0–40.0)

## 2015-01-26 LAB — CBC WITH DIFFERENTIAL/PLATELET
BASOS ABS: 0 10*3/uL (ref 0.0–0.1)
Basophils Relative: 0.5 % (ref 0.0–3.0)
EOS PCT: 2.1 % (ref 0.0–5.0)
Eosinophils Absolute: 0.1 10*3/uL (ref 0.0–0.7)
HEMATOCRIT: 36 % (ref 36.0–46.0)
Hemoglobin: 11.9 g/dL — ABNORMAL LOW (ref 12.0–15.0)
LYMPHS ABS: 2.2 10*3/uL (ref 0.7–4.0)
Lymphocytes Relative: 42.2 % (ref 12.0–46.0)
MCHC: 33.1 g/dL (ref 30.0–36.0)
MCV: 91.3 fl (ref 78.0–100.0)
MONO ABS: 0.4 10*3/uL (ref 0.1–1.0)
MONOS PCT: 6.9 % (ref 3.0–12.0)
NEUTROS ABS: 2.5 10*3/uL (ref 1.4–7.7)
Neutrophils Relative %: 48.3 % (ref 43.0–77.0)
PLATELETS: 262 10*3/uL (ref 150.0–400.0)
RBC: 3.95 Mil/uL (ref 3.87–5.11)
RDW: 14.2 % (ref 11.5–15.5)
WBC: 5.1 10*3/uL (ref 4.0–10.5)

## 2015-01-26 LAB — T4, FREE: Free T4: 0.94 ng/dL (ref 0.60–1.60)

## 2015-01-26 LAB — VITAMIN D 25 HYDROXY (VIT D DEFICIENCY, FRACTURES): VITD: 33.01 ng/mL (ref 30.00–100.00)

## 2015-01-26 LAB — TSH: TSH: 0.17 u[IU]/mL — AB (ref 0.35–4.50)

## 2015-01-26 LAB — VITAMIN B12: Vitamin B-12: 357 pg/mL (ref 211–911)

## 2015-01-26 NOTE — Assessment & Plan Note (Signed)
With constipation. Symptoms improved with addition of miralax to linzess. Followed by GI.

## 2015-01-26 NOTE — Assessment & Plan Note (Signed)
Diet controlled. Recheck a1c today.

## 2015-01-26 NOTE — Assessment & Plan Note (Signed)
Due for labs today. ? Symptoms of under or over correction. Continue current dose of synthroid for now.

## 2015-01-26 NOTE — Assessment & Plan Note (Signed)
Discussed USPSTF recommendations of cervical cancer screening.  She is aware that interval of 3 years is recommended but pt would prefer to have pap smear done today.  

## 2015-01-26 NOTE — Assessment & Plan Note (Signed)
Reviewed preventive care protocols, scheduled due services, and updated immunizations Discussed nutrition, exercise, diet, and healthy lifestyle.  Orders Placed This Encounter  Procedures  . CBC with Differential/Platelet  . Comprehensive metabolic panel  . Lipid panel  . TSH  . T4, Free  . Vitamin B12  . Vitamin D, 25-hydroxy

## 2015-01-26 NOTE — Assessment & Plan Note (Signed)
New- likely multifactorial.  Will add some labs today for further evaluation.

## 2015-01-26 NOTE — Progress Notes (Signed)
Pre visit review using our clinic review tool, if applicable. No additional management support is needed unless otherwise documented below in the visit note. 

## 2015-01-26 NOTE — Progress Notes (Signed)
Subjective:   Patient ID: Kelly Wyatt, female    DOB: [birth year], 54 y.o.   MRN: [id number]  Kelly Wyatt is a pleasant 54 y.o. year old female who presents to clinic today with Annual Exam and Constipation  on 01/26/2015  HPI:  54 yo female with complicated medical history, including cervical somatic dysfunction here for CPX and follow up of chronic medical conditions.   Having a rough few months- just had more "nerves burned" in her neck.  Headaches have improved a little.  Mammogram done yesterday- 01/25/15 Last colonoscopy 02/28/11 S/p hysterectomy but does have cervix- last pap smear 12/10/13 (done by me). No h/o abnormal pap smears.  Restarted premarin for hot flashes.  Declines vaginal dryness.  IBS- still having issues with constipation.  Now taking Miralax with linzess.  Feeling tired and achy all the time.  She does take synthroid 125 mcg daily.  She thinks maybe her thyroid is under corrected.   HLD- on N[facility] Lab Results  Component Value Date   CHOL 268* 11/18/2013   HDL 122.20 11/18/2013   LDLCALC 129* 11/18/2013   LDLDIRECT 200.3 07/16/2012   TRIG 82.0 11/18/2013   CHOLHDL 2 11/18/2013     Lab Results  Component Value Date   CREATININE 0.7 11/18/2013   Lab Results  Component Value Date   TSH 1.61 11/18/2013   Lab Results  Component Value Date   WBC 6.1 11/18/2013   HGB 12.3 11/18/2013   HCT 36.8 11/18/2013   MCV 93.3 11/18/2013   PLT 226.0 11/18/2013   Lab Results  Component Value Date   NA 139 11/18/2013   K 4.0 11/18/2013   CL 103 11/18/2013   CO2 30 11/18/2013   Lab Results  Component Value Date   HGBA1C 6.2 11/18/2013    Patient Active Problem List   Diagnosis Date Noted  . Fatigue 01/26/2015  . Well woman exam 01/26/2015  . Weight gain 12/10/2013  . Encounter for routine gynecological examination 12/10/2013  . IBS (irritable bowel syndrome) 10/01/2012  . Diabetes mellitus 04/04/2011  . CERVICAL SOMATIC DYSFUNCTION  03/27/2010  . HYPERCHOLESTEROLEMIA 04/27/2008  . Hypothyroidism 01/02/2007  . 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 INTERNAL HEMORRHOIDS 09/06/2004  . DIVERTICULOSIS, COLON 09/06/2004  . 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 Diverticulosis of colon 09/06/2004   Past Surgical History  Procedure Laterality Date  . Abdominal hysterectomy      cervix left in   History  Substance Use Topics  . Smoking status: Former Games developer  . Smokeless tobacco: Not on file  . Alcohol Use: No   Family History  Problem Relation Age of Onset  . Factor V Leiden deficiency Mother    No Known Allergies Current Outpatient Prescriptions on File Prior to Visit  Medication Sig Dispense Refill  . albuterol (PROVENTIL HFA;VENTOLIN HFA) 108 (90 BASE) MCG/ACT inhaler Inhale 2 puffs into the lungs every 6 (six) hours as needed. 1 Inhaler 0  . baclofen (LIORESAL) 10 MG tablet Take 40 to 60 mg's by mouth daily (Patient taking differently: Take110 mg by mouth daily) 90 each 1  . diazepam (VALIUM) 2 MG tablet Take 5 mg by mouth daily.    . DULoxetine (CYMBALTA) 30 MG capsule Take 30 mg by mouth daily.    . DULoxetine (CYMBALTA) 60 MG capsule Take 60 mg by mouth daily.    Marland Kitchen estrogens, conjugated, (PREMARIN) 0.9 MG tablet Take 1 tablet by mouth   daily  for 21 days then do   not take for 7 days. 68 tablet 2  . LINZESS 290 MCG CAPS capsule Take 1 capsule by mouth  daily 90 capsule 1  . niacin (NIASPAN) 500 MG CR tablet Take 2 tablets by mouth at  bedtime 180 tablet 1  . oxyCODONE-acetaminophen (PERCOCET) 10-325 MG per tablet Take 1 tablet by mouth every 4 (four) hours as needed.      . phentermine 37.5 MG capsule Take 1 capsule (37.5 mg total) by mouth every morning. 30 capsule 1  . SYNTHROID 125 MCG tablet Take 1 tablet by mouth  daily 30 tablet 0   No current facility-administered medications on file prior to visit.   The PMH, PSH, Social History, Family History, Medications, and  allergies have been reviewed in [facility], and have been updated if relevant.    Review of Systems  Constitutional: Positive for fatigue.  HENT: Negative.   Eyes: Negative.   Respiratory: Negative.   Cardiovascular: Negative.   Gastrointestinal: Positive for constipation. Negative for nausea, vomiting, abdominal pain, diarrhea, blood in stool, abdominal distention, anal bleeding and rectal pain.  Endocrine: Negative.   Genitourinary: Negative.   Musculoskeletal: Positive for back pain, neck pain and neck stiffness.  Skin: Negative.   Allergic/Immunologic: Negative.   Neurological: Negative.   Hematological: Negative.   All other systems reviewed and are negative.      Objective:    BP 118/66 mmHg  Pulse 83  Temp(Src) 97.4 F (36.3 C) (Oral)  Ht  (1.676 m)  Wt 170 lb 8 oz (77.338 kg)  BMI 27.53 kg/m2  SpO2 98%   Physical Exam  General:  Well-developed,well-nourished,in no acute distress; alert,appropriate and cooperative throughout examination Head:  normocephalic and atraumatic.   Eyes:  vision grossly intact, pupils equal, pupils round, and pupils reactive to light.   Ears:  R ear normal and L ear normal.   Nose:  no external deformity.   Mouth:  good dentition.   Neck:  No deformities, masses, or tenderness noted. Breasts:  No mass, nodules, thickening, tenderness, bulging, retraction, inflamation, nipple discharge or skin changes noted.   Lungs:  Normal respiratory effort, chest expands symmetrically. Lungs are clear to auscultation, no crackles or wheezes. Heart:  Normal rate and regular rhythm. S1 and S2 normal without gallop, murmur, click, rub or other extra sounds. Abdomen:  Bowel sounds positive,abdomen soft and non-tender without masses, organomegaly or hernias noted. Rectal:  no external abnormalities.   Genitalia:  Pelvic Exam:        External: normal female genitalia without lesions or masses        Vagina: normal without lesions or masses        Cervix:  normal without lesions or masses        Adnexa: normal bimanual exam without masses or fullness        Uterus: absent        Pap smear: performed Msk:  No deformity or scoliosis noted of thoracic or lumbar spine.   Extremities:  No clubbing, cyanosis, edema, or deformity noted with normal full range of motion of all joints.   Neurologic:  alert & oriented X3 and gait normal.   Skin:  Intact without suspicious lesions or rashes Cervical Nodes:  No lymphadenopathy noted Axillary Nodes:  No palpable lymphadenopathy Psych:  Cognition and judgment appear intact. Alert and cooperative with normal attention span and concentration. No apparent delusions, illusions, hallucinations        Assessment &  Plan:   Well woman exam - Plan: CBC with Differential/Platelet, Comprehensive metabolic panel, Lipid panel  Other fatigue - Plan: Vitamin B12, Vitamin D, 25-hydroxy  Toxic diffuse goiter - Plan: TSH, T4, Free  Other specified hypothyroidism  Encounter for routine gynecological examination  IBS (irritable bowel syndrome)  Type 2 diabetes mellitus without complication No Follow-up on file.

## 2015-01-27 LAB — CYTOLOGY - PAP

## 2015-01-28 ENCOUNTER — Encounter: Payer: Self-pay | Admitting: *Deleted

## 2015-01-28 ENCOUNTER — Telehealth: Payer: Self-pay | Admitting: Family Medicine

## 2015-01-28 NOTE — Telephone Encounter (Signed)
Pt returned your call [phone]

## 2015-01-31 ENCOUNTER — Other Ambulatory Visit: Payer: Self-pay | Admitting: Family Medicine

## 2015-01-31 ENCOUNTER — Other Ambulatory Visit: Payer: Self-pay

## 2015-01-31 DIAGNOSIS — E039 Hypothyroidism, unspecified: Secondary | ICD-10-CM

## 2015-01-31 MED ORDER — LEVOTHYROXINE SODIUM 112 MCG PO TABS: 112.0000 ug | ORAL_TABLET | Freq: Every day | ORAL | Status: DC

## 2015-02-01 ENCOUNTER — Telehealth: Payer: Self-pay

## 2015-02-01 MED ORDER — LEVOTHYROXINE SODIUM 112 MCG PO TABS: 112.0000 ug | ORAL_TABLET | Freq: Every day | ORAL | Status: DC

## 2015-02-01 NOTE — Telephone Encounter (Signed)
Pt left v/m; generic synthroid was sent to [facility]; pt said she cannot take generic synthroid; pt said has been taking name brand synthroid and request 90 day namebrand synthroid sent to [facility].

## 2015-02-01 NOTE — Telephone Encounter (Signed)
Ok to send new rx as requested.

## 2015-02-01 NOTE — Telephone Encounter (Signed)
Rx re-sent as requested.

## 2015-02-15 ENCOUNTER — Encounter: Payer: Self-pay | Admitting: Family Medicine

## 2015-02-16 NOTE — Telephone Encounter (Signed)
Patient wanted to let Dr.Aron know she scheduled an appointment on 02/22/15 at 7:15.

## 2015-02-22 ENCOUNTER — Encounter: Payer: Self-pay | Admitting: Family Medicine

## 2015-02-22 ENCOUNTER — Ambulatory Visit (INDEPENDENT_AMBULATORY_CARE_PROVIDER_SITE_OTHER): Payer: Commercial Managed Care - PPO | Admitting: Family Medicine

## 2015-02-22 VITALS — BP 124/78 | Temp 97.8°F | Wt 172.0 lb

## 2015-02-22 DIAGNOSIS — E038 Other specified hypothyroidism: Secondary | ICD-10-CM | POA: Diagnosis not present

## 2015-02-22 LAB — TSH: TSH: 0.54 u[IU]/mL (ref 0.35–4.50)

## 2015-02-22 LAB — T4, FREE: Free T4: 1.08 ng/dL (ref 0.60–1.60)

## 2015-02-22 LAB — T3, FREE: T3, Free: 3 pg/mL (ref 2.3–4.2)

## 2015-02-22 NOTE — Progress Notes (Signed)
Pre visit review using our clinic review tool, if applicable. No additional management support is needed unless otherwise documented below in the visit note. 

## 2015-02-22 NOTE — Assessment & Plan Note (Signed)
Clinically seems to be improving. Continue current dose synthroid.  Recheck labs today.  Orders Placed This Encounter  Procedures  . TSH  . T4, Free  . T3, Free

## 2015-02-22 NOTE — Patient Instructions (Signed)
Great to see you.  We will call you with your lab results. 

## 2015-02-22 NOTE — Progress Notes (Signed)
Subjective:   Patient ID: Kelly Wyatt, female    DOB: June 09, 1961, 54 y.o.   MRN: [id number]  Kelly Wyatt is a pleasant 54 y.o. year old female who presents to clinic today with Follow-up  on 02/22/2015  HPI:  Hypothyroidism- last month, TSH was low.  We therefore decreased her synthroid to 112 mcg daily. Feeling a little better.  Was tired achy and having lower extremity edema prior to [place] changing dose.   Remote h/o thyroidectomy.  Lab Results  Component Value Date   TSH 0.17* 01/26/2015   Current Outpatient Prescriptions on File Prior to Visit  Medication Sig Dispense Refill  . albuterol (PROVENTIL HFA;VENTOLIN HFA) 108 (90 BASE) MCG/ACT inhaler Inhale 2 puffs into the lungs every 6 (six) hours as needed. 1 Inhaler 0  . baclofen (LIORESAL) 10 MG tablet Take 40 to 60 mg's by mouth daily (Patient taking differently: 30 mg 3 (three) times daily. ) 90 each 1  . diazepam (VALIUM) 2 MG tablet Take 5 mg by mouth daily.    . DULoxetine (CYMBALTA) 30 MG capsule Take 30 mg by mouth daily.    . DULoxetine (CYMBALTA) 60 MG capsule Take 60 mg by mouth daily.    Marland Kitchen estrogens, conjugated, (PREMARIN) 0.9 MG tablet Take 1 tablet by mouth   daily for 21 days then do   not take for 7 days. 68 tablet 2  . levothyroxine (SYNTHROID, LEVOTHROID) 112 MCG tablet Take 1 tablet (112 mcg total) by mouth daily. 90 tablet 3  . niacin (NIASPAN) 500 MG CR tablet Take 2 tablets by mouth at  bedtime 180 tablet 1  . phentermine 37.5 MG capsule Take 1 capsule (37.5 mg total) by mouth every morning. 30 capsule 1   No current facility-administered medications on file prior to visit.    No Known Allergies  Past Medical History  Diagnosis Date  . Diverticulosis of colon 09/06/2004    Past Surgical History  Procedure Laterality Date  . Abdominal hysterectomy      cervix left in  . Thyroidectomy      Family History  Problem Relation Age of Onset  . Factor V Leiden deficiency Mother     History    Social History  . Marital Status: Single    Spouse Name: N/A  . Number of Children: 0  . Years of Education: N/A   Occupational History  . Not on file.   Social History Main Topics  . Smoking status: Former Games developer  . Smokeless tobacco: Not on file  . Alcohol Use: No  . Drug Use: No  . Sexual Activity: Not on file   Other Topics Concern  . Not on file   Social History Narrative   The PMH, PSH, Social History, Family History, Medications, and allergies have been reviewed in [facility], and have been updated if relevant.   Review of Systems  Constitutional: Positive for fatigue. Negative for unexpected weight change.  Respiratory: Negative.   Cardiovascular: Positive for leg swelling. Negative for chest pain and palpitations.  Gastrointestinal: Negative.   Musculoskeletal: Positive for back pain and arthralgias.  Allergic/Immunologic: Negative.   Neurological: Negative.   Hematological: Negative.   Psychiatric/Behavioral: Negative.   All other systems reviewed and are negative.      Objective:    BP 124/78 mmHg  Temp(Src) 97.8 F (36.6 C) (Oral)  Wt 172 lb (78.019 kg)   Physical Exam  Constitutional: She is oriented to person, place, and time. She appears well-developed  and well-nourished. No distress.  HENT:  Head: Normocephalic.  Eyes: Conjunctivae are normal.  Neck: Normal range of motion.  Cardiovascular: Normal rate, regular rhythm and normal heart sounds.   Pulmonary/Chest: Effort normal and breath sounds normal. No respiratory distress.  Musculoskeletal: She exhibits no edema or tenderness.  Neurological: She is alert and oriented to person, place, and time. No cranial nerve deficit.  Skin: Skin is warm and dry.  Psychiatric: She has a normal mood and affect. Her behavior is normal. Judgment and thought content normal.  Nursing note and vitals reviewed.         Assessment & Plan:   Other specified hypothyroidism - Plan: TSH, T4, Free, T3, Free No  Follow-up on file.

## 2015-03-01 ENCOUNTER — Encounter: Payer: Self-pay | Admitting: Family Medicine

## 2015-03-02 MED ORDER — PHENTERMINE HCL 37.5 MG PO CAPS: 37.5000 mg | ORAL_CAPSULE | ORAL | Status: DC

## 2015-03-02 NOTE — Telephone Encounter (Signed)
Rx called in to requested pharmacy 

## 2015-03-24 ENCOUNTER — Encounter: Payer: Self-pay | Admitting: Internal Medicine

## 2015-05-17 ENCOUNTER — Other Ambulatory Visit: Payer: Self-pay | Admitting: Family Medicine

## 2015-06-22 ENCOUNTER — Encounter: Payer: Self-pay | Admitting: Family Medicine

## 2015-06-23 MED ORDER — LINACLOTIDE 290 MCG PO CAPS: 290.0000 ug | ORAL_CAPSULE | Freq: Every day | ORAL | Status: DC

## 2015-06-23 MED ORDER — PHENTERMINE HCL 37.5 MG PO CAPS: 37.5000 mg | ORAL_CAPSULE | ORAL | Status: DC

## 2015-06-23 NOTE — Telephone Encounter (Signed)
Last f/u 01/2015-CPE 

## 2015-11-03 ENCOUNTER — Other Ambulatory Visit: Payer: Self-pay | Admitting: *Deleted

## 2015-11-03 MED ORDER — LINACLOTIDE 290 MCG PO CAPS: 290.0000 ug | ORAL_CAPSULE | Freq: Every day | ORAL | Status: DC

## 2015-12-21 ENCOUNTER — Other Ambulatory Visit: Payer: Self-pay | Admitting: *Deleted

## 2015-12-21 MED ORDER — LEVOTHYROXINE SODIUM 112 MCG PO TABS: 112.0000 ug | ORAL_TABLET | Freq: Every day | ORAL | Status: DC

## 2015-12-21 NOTE — Telephone Encounter (Signed)
Pt left voicemail at Triage. Pt just need Rx filled until her CPE with Dr. Dayton MartesAron next month, done and left voicemail letting pt know Rx sent

## 2016-01-24 ENCOUNTER — Encounter: Payer: Commercial Managed Care - PPO | Admitting: Family Medicine

## 2016-02-13 ENCOUNTER — Encounter: Payer: Commercial Managed Care - PPO | Admitting: Family Medicine

## 2016-02-16 ENCOUNTER — Ambulatory Visit (INDEPENDENT_AMBULATORY_CARE_PROVIDER_SITE_OTHER): Payer: Commercial Managed Care - PPO | Admitting: Family Medicine

## 2016-02-16 ENCOUNTER — Other Ambulatory Visit (HOSPITAL_COMMUNITY)
Admission: RE | Admit: 2016-02-16 | Discharge: 2016-02-16 | Disposition: A | Discharge status: Home / Self Care | Payer: Commercial Managed Care - PPO | Source: Ambulatory Visit | Attending: Family Medicine | Admitting: Family Medicine

## 2016-02-16 ENCOUNTER — Encounter: Payer: Self-pay | Admitting: Family Medicine

## 2016-02-16 VITALS — BP 127/86 | HR 84 | Temp 98.4°F | Ht 65.5 in | Wt 165.0 lb

## 2016-02-16 DIAGNOSIS — E038 Other specified hypothyroidism: Secondary | ICD-10-CM

## 2016-02-16 DIAGNOSIS — Z01419 Encounter for gynecological examination (general) (routine) without abnormal findings: Secondary | ICD-10-CM | POA: Insufficient documentation

## 2016-02-16 DIAGNOSIS — M9901 Segmental and somatic dysfunction of cervical region: Secondary | ICD-10-CM

## 2016-02-16 DIAGNOSIS — E7800 Pure hypercholesterolemia, unspecified: Secondary | ICD-10-CM | POA: Diagnosis not present

## 2016-02-16 DIAGNOSIS — Z1151 Encounter for screening for human papillomavirus (HPV): Secondary | ICD-10-CM | POA: Diagnosis not present

## 2016-02-16 DIAGNOSIS — E119 Type 2 diabetes mellitus without complications: Secondary | ICD-10-CM

## 2016-02-16 DIAGNOSIS — Z0000 Encounter for general adult medical examination without abnormal findings: Secondary | ICD-10-CM

## 2016-02-16 DIAGNOSIS — Z1159 Encounter for screening for other viral diseases: Secondary | ICD-10-CM

## 2016-02-16 LAB — CBC WITH DIFFERENTIAL/PLATELET
BASOS PCT: 0.4 % (ref 0.0–3.0)
Basophils Absolute: 0 10*3/uL (ref 0.0–0.1)
EOS PCT: 1.7 % (ref 0.0–5.0)
Eosinophils Absolute: 0.1 10*3/uL (ref 0.0–0.7)
HCT: 36 % (ref 36.0–46.0)
HEMOGLOBIN: 12 g/dL (ref 12.0–15.0)
LYMPHS ABS: 2.6 10*3/uL (ref 0.7–4.0)
Lymphocytes Relative: 45.4 % (ref 12.0–46.0)
MCHC: 33.3 g/dL (ref 30.0–36.0)
MCV: 93.5 fl (ref 78.0–100.0)
MONO ABS: 0.5 10*3/uL (ref 0.1–1.0)
Monocytes Relative: 9.5 % (ref 3.0–12.0)
Neutro Abs: 2.4 10*3/uL (ref 1.4–7.7)
Neutrophils Relative %: 43 % (ref 43.0–77.0)
Platelets: 250 10*3/uL (ref 150.0–400.0)
RBC: 3.85 Mil/uL — ABNORMAL LOW (ref 3.87–5.11)
RDW: 14.9 % (ref 11.5–15.5)
WBC: 5.7 10*3/uL (ref 4.0–10.5)

## 2016-02-16 LAB — VITAMIN D 25 HYDROXY (VIT D DEFICIENCY, FRACTURES): VITD: 30.8 ng/mL (ref 30.00–100.00)

## 2016-02-16 LAB — T3, FREE: T3, Free: 2.2 pg/mL — ABNORMAL LOW (ref 2.3–4.2)

## 2016-02-16 LAB — TSH: TSH: 5.78 u[IU]/mL — AB (ref 0.35–4.50)

## 2016-02-16 LAB — T4, FREE: Free T4: 0.92 ng/dL (ref 0.60–1.60)

## 2016-02-16 NOTE — Assessment & Plan Note (Signed)
Continue current dose of synthroid.  Check labs today. 

## 2016-02-16 NOTE — Assessment & Plan Note (Addendum)
Unfortunately having a tough time with this again. [facility].

## 2016-02-16 NOTE — Progress Notes (Signed)
Pre visit review using our clinic review tool, if applicable. No additional management support is needed unless otherwise documented below in the visit note. 

## 2016-02-16 NOTE — Patient Instructions (Signed)
Great to see you.  We will call you with your results. 

## 2016-02-16 NOTE — Addendum Note (Signed)
Addended by: Desmond DikeKNIGHT, Arian Mcquitty H on: 02/16/2016 12:36 PM   Modules accepted: Orders, SmartSet

## 2016-02-16 NOTE — Assessment & Plan Note (Signed)
Discussed USPSTF recommendations of cervical cancer screening.  She is aware that interval of 3 years is recommended but pt would prefer to have pap smear done today.  
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
## 2016-02-17 LAB — COMPREHENSIVE METABOLIC PANEL
ALBUMIN: 4.5 g/dL (ref 3.5–5.2)
ALT: 21 U/L (ref 0–35)
AST: 24 U/L (ref 0–37)
Alkaline Phosphatase: 62 U/L (ref 39–117)
BUN: 16 mg/dL (ref 6–23)
CHLORIDE: 101 meq/L (ref 96–112)
CO2: 29 mEq/L (ref 19–32)
Calcium: 10 mg/dL (ref 8.4–10.5)
Creatinine, Ser: 0.72 mg/dL (ref 0.40–1.20)
GFR: 89.36 mL/min (ref >60.00)
Glucose, Bld: 97 mg/dL (ref 70–99)
POTASSIUM: 4.1 meq/L (ref 3.5–5.1)
SODIUM: 141 meq/L (ref 135–145)
Total Bilirubin: 0.5 mg/dL (ref 0.2–1.2)
Total Protein: 7.5 g/dL (ref 6.0–8.3)

## 2016-02-17 LAB — CYTOLOGY - PAP

## 2016-02-17 LAB — LIPID PANEL
CHOLESTEROL: 294 mg/dL — AB (ref 0–200)
HDL: 109.3 mg/dL (ref >39.00)
LDL CALC: 170 mg/dL — AB (ref 0–99)
NonHDL: 185
TRIGLYCERIDES: 73 mg/dL (ref 0.0–149.0)
Total CHOL/HDL Ratio: 3
VLDL: 14.6 mg/dL (ref 0.0–40.0)

## 2016-02-17 LAB — HEPATITIS C ANTIBODY: HCV AB: NEGATIVE

## 2016-02-22 ENCOUNTER — Other Ambulatory Visit: Payer: Self-pay | Admitting: *Deleted

## 2016-02-22 ENCOUNTER — Other Ambulatory Visit: Payer: Commercial Managed Care - PPO

## 2016-02-22 MED ORDER — LINACLOTIDE 290 MCG PO CAPS: 290.0000 ug | ORAL_CAPSULE | Freq: Every day | ORAL | Status: DC

## 2016-02-23 ENCOUNTER — Encounter: Payer: Self-pay | Admitting: Family Medicine

## 2016-02-23 ENCOUNTER — Other Ambulatory Visit: Payer: Self-pay | Admitting: Family Medicine

## 2016-02-23 MED ORDER — LEVOTHYROXINE SODIUM 125 MCG PO TABS: 125.0000 ug | ORAL_TABLET | Freq: Every day | ORAL | Status: DC

## 2016-03-16 ENCOUNTER — Other Ambulatory Visit: Payer: Commercial Managed Care - PPO

## 2016-04-13 ENCOUNTER — Other Ambulatory Visit: Payer: Self-pay | Admitting: Family Medicine

## 2016-04-13 DIAGNOSIS — E038 Other specified hypothyroidism: Secondary | ICD-10-CM

## 2016-04-14 IMAGING — MR MR CERVICAL SPINE W/O CM
5 series · 29 of 48 positions shown · non-contrast
Comparison: 03/29/2010

CLINICAL DATA: Cervical dystonia. Neck pain. Right upper back pain.

EXAM:
MRI CERVICAL SPINE WITHOUT CONTRAST
TECHNIQUE: Multiplanar, multisequence MR imaging of the cervical spine was
performed. No intravenous contrast was administered.

[Series 3: T2 · sagittal · 3.3mm · 0.41mm/px · 6 of 12 slices shown (1 of 2)]
[im 1/12]
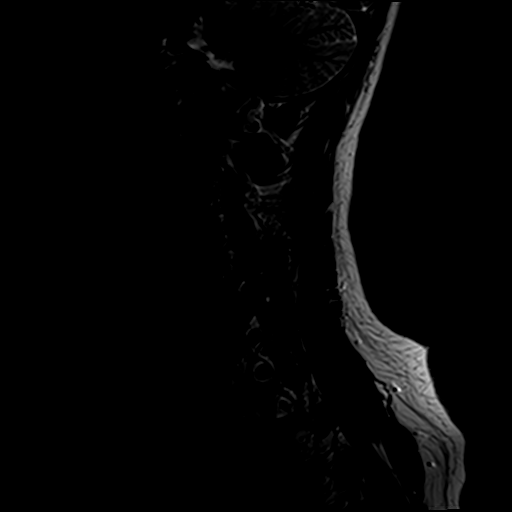
[im 3/12]
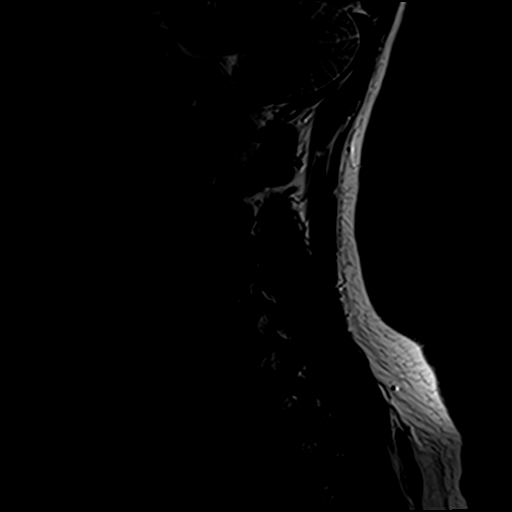
[im 5/12]
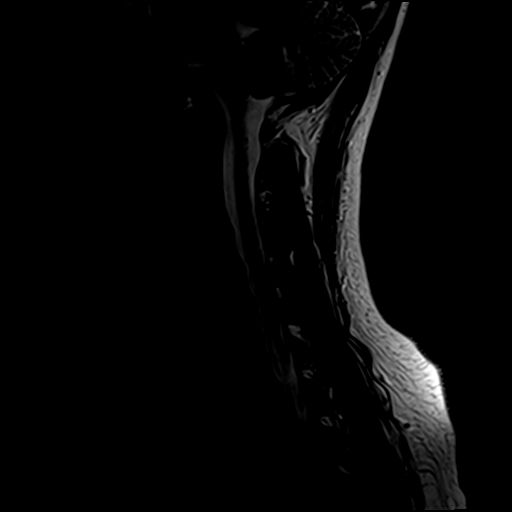
[im 7/12]
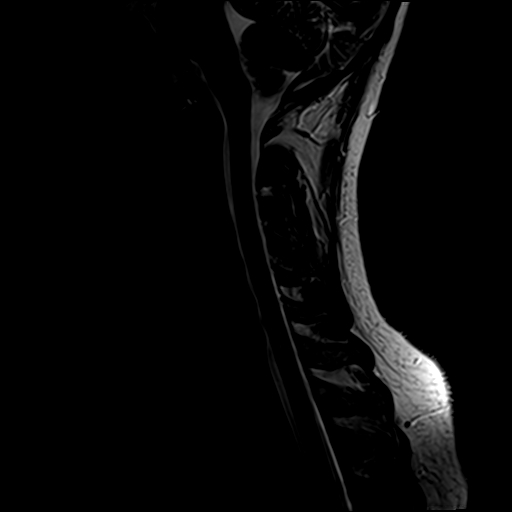
[im 9/12]
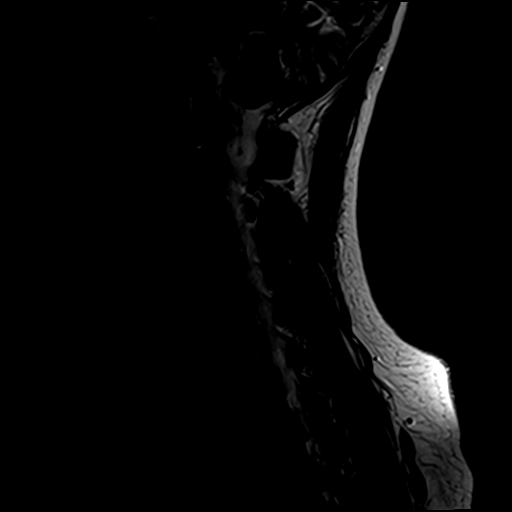
[im 12/12]
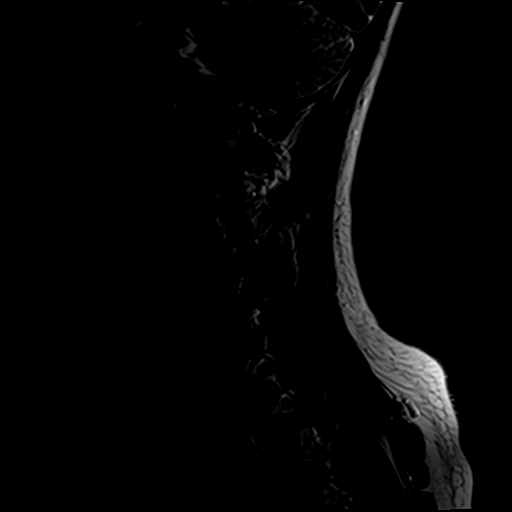

[Series 4: T1 · sagittal · 3.3mm · 0.41mm/px · 7 of 12 slices shown]
[im 1/12]
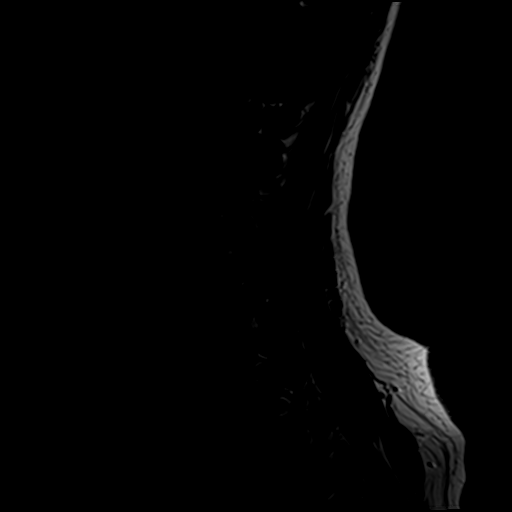
[im 2/12]
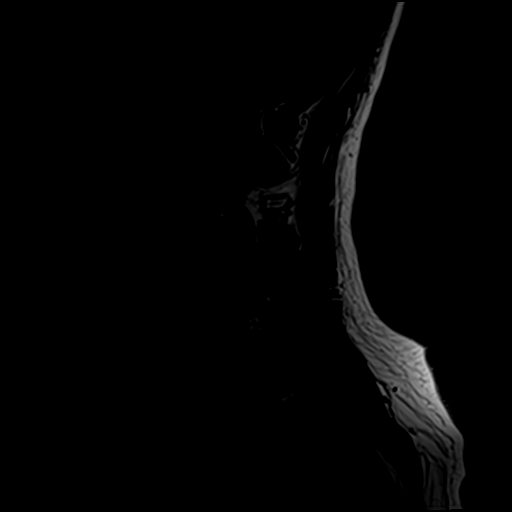
[im 4/12]
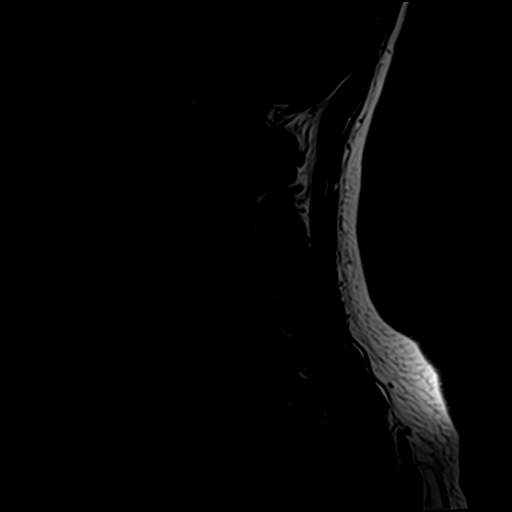
[im 6/12]
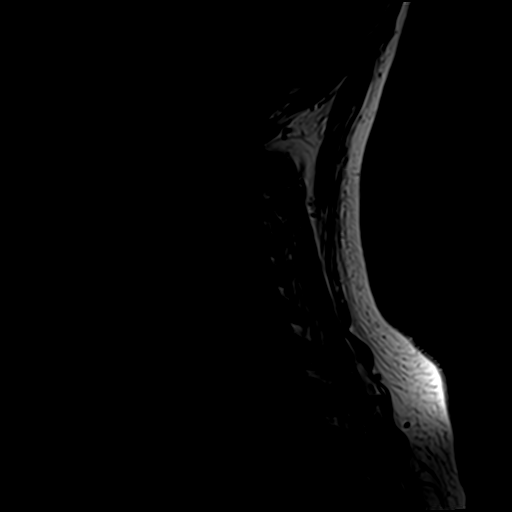
[im 8/12]
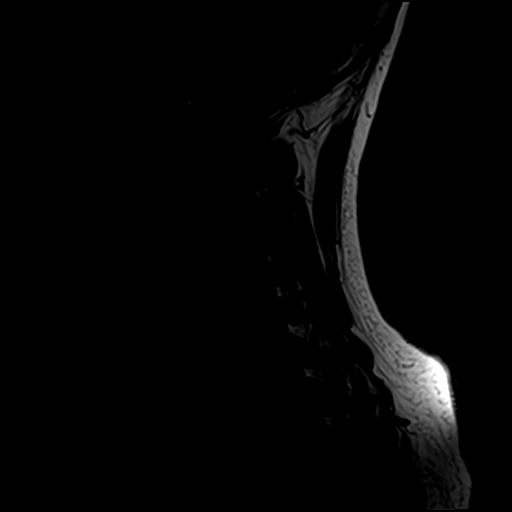
[im 10/12]
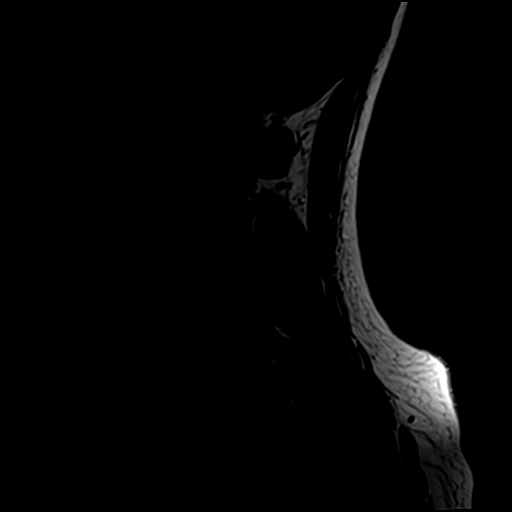
[im 12/12]
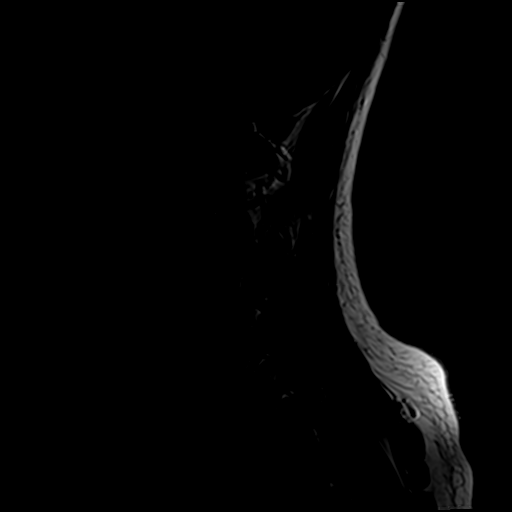

[Series 5: STIR · sagittal · 3.3mm · 0.82mm/px · 7 of 12 slices shown]
[im 1/12]
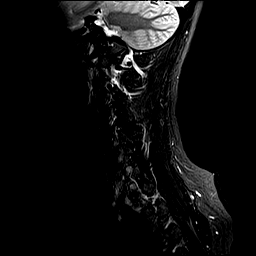
[im 2/12]
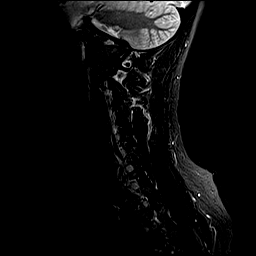
[im 4/12]
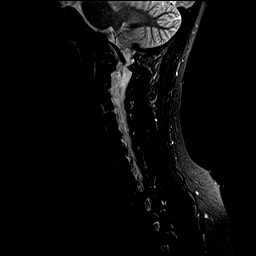
[im 6/12]
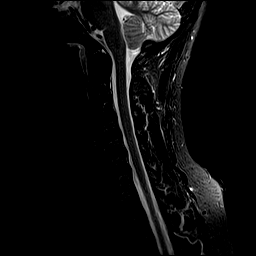
[im 8/12]
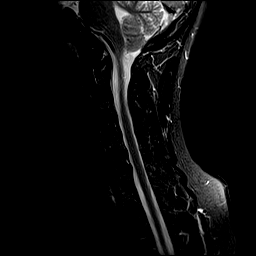
[im 10/12]
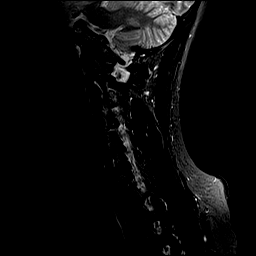
[im 12/12]
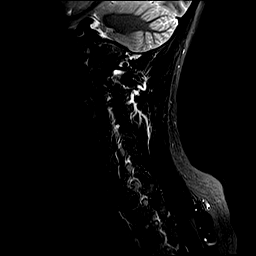

[Series 6: GRE · axial · 3.0mm · 0.35mm/px · 1 of 24 slices shown]
[im 1/24]
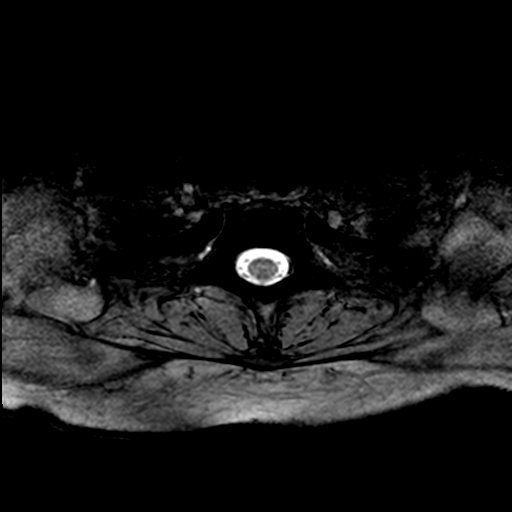

[Series 7: T2 · axial · 3.0mm · 0.70mm/px · z∈[-72,+14]mm · 8 of 24 slices shown (2 of 2)]
[im 1/24]
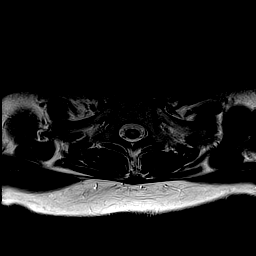
[im 4/24]
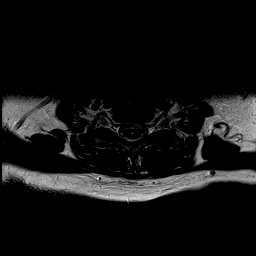
[im 8/24]
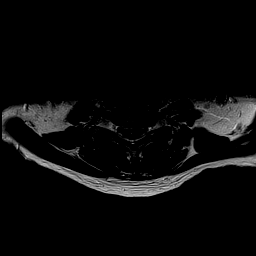
[im 11/24]
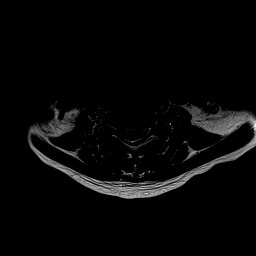
[im 13/24]
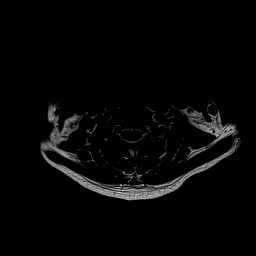
[im 16/24]
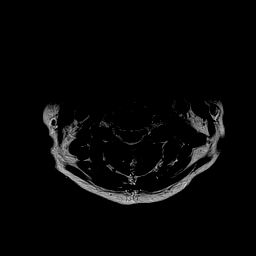
[im 20/24]
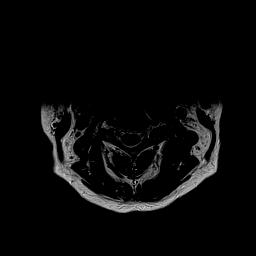
[im 24/24]
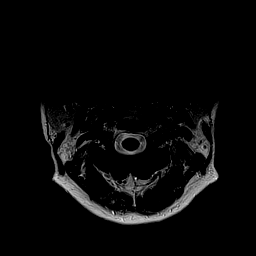

[29 of 48 positions shown; findings below may reference images not displayed]

FINDINGS: 7 mm Tornwaldt cyst. Craniocervical junction unremarkable. 2.1 cm
central cord syrinx extending from the mid C6 to the C7-T1 level.
This appears stable. Cord otherwise unremarkable.

No vertebral subluxation is observed. Cervical disc desiccation
noted with loss of disc height at C5-6 and C6-7. No significant
vertebral marrow edema is identified. Additional findings at
individual levels are as follows:

C2-3:  Unremarkable.

C3-4: No impingement. Left lateral recess disc protrusion extends
caudad.

C4-5:  No impingement.  Right uncinate spurring.  Mild disc bulge.

C5-6: Moderate bilateral foraminal stenosis due to uncinate spurring
and disc bulge. This appears mildly worsened compared to prior.

C6-7: Stable mild left foraminal stenosis due to uncinate spurring
and diffuse disc bulge.

C7-T1:  Unremarkable.
IMPRESSION: 1. Cervical spondylosis and degenerative disc disease, causing
moderate impingement at C5-6 (which is increased compared to the
prior exam) and mild left-sided impingement at C6-7 (stable compared
to the prior exam), as detailed above.
2. 2.1 cm thin syrinx extending from the mid C6 level to the C7-T1
level, stable from prior.

## 2016-04-17 ENCOUNTER — Other Ambulatory Visit: Payer: Commercial Managed Care - PPO

## 2016-05-02 ENCOUNTER — Other Ambulatory Visit (INDEPENDENT_AMBULATORY_CARE_PROVIDER_SITE_OTHER): Payer: Commercial Managed Care - PPO

## 2016-05-02 DIAGNOSIS — E038 Other specified hypothyroidism: Secondary | ICD-10-CM

## 2016-05-02 LAB — TSH: TSH: 4.61 u[IU]/mL — AB (ref 0.35–4.50)

## 2016-05-02 LAB — T3, FREE: T3, Free: 2.6 pg/mL (ref 2.3–4.2)

## 2016-05-02 LAB — T4, FREE: FREE T4: 0.98 ng/dL (ref 0.60–1.60)

## 2016-05-03 ENCOUNTER — Encounter: Payer: Self-pay | Admitting: Family Medicine

## 2016-05-04 MED ORDER — NIACIN ER (ANTIHYPERLIPIDEMIC) 500 MG PO TBCR: 1000.0000 mg | EXTENDED_RELEASE_TABLET | Freq: Every day | ORAL | 2 refills | Status: DC

## 2016-06-22 ENCOUNTER — Encounter: Payer: Self-pay | Admitting: Family Medicine

## 2016-12-11 ENCOUNTER — Encounter: Payer: Self-pay | Admitting: Family Medicine

## 2016-12-12 ENCOUNTER — Other Ambulatory Visit: Payer: Self-pay | Admitting: Family Medicine

## 2016-12-12 ENCOUNTER — Other Ambulatory Visit (INDEPENDENT_AMBULATORY_CARE_PROVIDER_SITE_OTHER): Payer: Commercial Managed Care - PPO

## 2016-12-12 DIAGNOSIS — E038 Other specified hypothyroidism: Secondary | ICD-10-CM

## 2016-12-12 LAB — TSH: TSH: 3.86 u[IU]/mL (ref 0.35–4.50)

## 2016-12-12 LAB — T3, FREE: T3, Free: 2.8 pg/mL (ref 2.3–4.2)

## 2016-12-12 LAB — T4, FREE: FREE T4: 1.09 ng/dL (ref 0.60–1.60)

## 2017-04-17 ENCOUNTER — Encounter: Payer: Self-pay | Admitting: Family Medicine

## 2017-04-17 ENCOUNTER — Other Ambulatory Visit: Payer: Self-pay | Admitting: Family Medicine

## 2017-04-17 ENCOUNTER — Other Ambulatory Visit: Payer: Self-pay

## 2017-04-17 MED ORDER — LEVOTHYROXINE SODIUM 125 MCG PO TABS: 125.0000 ug | ORAL_TABLET | Freq: Every day | ORAL | 0 refills | Status: DC

## 2017-04-17 MED ORDER — LINACLOTIDE 290 MCG PO CAPS: 290.0000 ug | ORAL_CAPSULE | Freq: Every day | ORAL | 0 refills | Status: DC
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
## 2017-04-18 MED ORDER — LEVOTHYROXINE SODIUM 125 MCG PO TABS: 125.0000 ug | ORAL_TABLET | Freq: Every day | ORAL | 0 refills | Status: DC

## 2017-04-18 MED ORDER — LINACLOTIDE 290 MCG PO CAPS: 290.0000 ug | ORAL_CAPSULE | Freq: Every day | ORAL | 0 refills | Status: DC

## 2017-07-18 ENCOUNTER — Encounter: Payer: Self-pay | Admitting: Family Medicine

## 2017-07-18 ENCOUNTER — Ambulatory Visit (INDEPENDENT_AMBULATORY_CARE_PROVIDER_SITE_OTHER): Payer: Commercial Managed Care - PPO | Admitting: Family Medicine

## 2017-07-18 ENCOUNTER — Other Ambulatory Visit (HOSPITAL_COMMUNITY)
Admission: RE | Admit: 2017-07-18 | Discharge: 2017-07-18 | Disposition: A | Discharge status: Home / Self Care | Payer: Commercial Managed Care - PPO | Source: Ambulatory Visit | Attending: Family Medicine | Admitting: Family Medicine

## 2017-07-18 VITALS — BP 110/78 | HR 86 | Temp 98.2°F | Ht 66.0 in | Wt 158.0 lb

## 2017-07-18 DIAGNOSIS — Z01419 Encounter for gynecological examination (general) (routine) without abnormal findings: Secondary | ICD-10-CM

## 2017-07-18 DIAGNOSIS — E0500 Thyrotoxicosis with diffuse goiter without thyrotoxic crisis or storm: Secondary | ICD-10-CM

## 2017-07-18 DIAGNOSIS — E038 Other specified hypothyroidism: Secondary | ICD-10-CM | POA: Diagnosis not present

## 2017-07-18 DIAGNOSIS — Z114 Encounter for screening for human immunodeficiency virus [HIV]: Secondary | ICD-10-CM

## 2017-07-18 DIAGNOSIS — E7800 Pure hypercholesterolemia, unspecified: Secondary | ICD-10-CM

## 2017-07-18 DIAGNOSIS — M9901 Segmental and somatic dysfunction of cervical region: Secondary | ICD-10-CM

## 2017-07-18 LAB — CBC WITH DIFFERENTIAL/PLATELET
BASOS ABS: 0 10*3/uL (ref 0.0–0.1)
Basophils Relative: 1 % (ref 0.0–3.0)
EOS PCT: 1.4 % (ref 0.0–5.0)
Eosinophils Absolute: 0.1 10*3/uL (ref 0.0–0.7)
HCT: 38.4 % (ref 36.0–46.0)
HEMOGLOBIN: 12.6 g/dL (ref 12.0–15.0)
LYMPHS ABS: 1.8 10*3/uL (ref 0.7–4.0)
Lymphocytes Relative: 41.2 % (ref 12.0–46.0)
MCHC: 32.9 g/dL (ref 30.0–36.0)
MCV: 96.3 fl (ref 78.0–100.0)
MONOS PCT: 9.7 % (ref 3.0–12.0)
Monocytes Absolute: 0.4 10*3/uL (ref 0.1–1.0)
NEUTROS PCT: 46.7 % (ref 43.0–77.0)
Neutro Abs: 2 10*3/uL (ref 1.4–7.7)
Platelets: 244 10*3/uL (ref 150.0–400.0)
RBC: 3.98 Mil/uL (ref 3.87–5.11)
RDW: 13.6 % (ref 11.5–15.5)
WBC: 4.3 10*3/uL (ref 4.0–10.5)

## 2017-07-18 LAB — LIPID PANEL
CHOLESTEROL: 290 mg/dL — AB (ref 0–200)
HDL: 89.1 mg/dL (ref >39.00)
LDL Cholesterol: 179 mg/dL — ABNORMAL HIGH (ref 0–99)
NONHDL: 200.83
Total CHOL/HDL Ratio: 3
Triglycerides: 108 mg/dL (ref 0.0–149.0)
VLDL: 21.6 mg/dL (ref 0.0–40.0)

## 2017-07-18 LAB — COMPREHENSIVE METABOLIC PANEL
ALBUMIN: 4.3 g/dL (ref 3.5–5.2)
ALK PHOS: 70 U/L (ref 39–117)
ALT: 16 U/L (ref 0–35)
AST: 19 U/L (ref 0–37)
BILIRUBIN TOTAL: 0.6 mg/dL (ref 0.2–1.2)
BUN: 19 mg/dL (ref 6–23)
CO2: 33 mEq/L — ABNORMAL HIGH (ref 19–32)
Calcium: 9.4 mg/dL (ref 8.4–10.5)
Chloride: 100 mEq/L (ref 96–112)
Creatinine, Ser: 0.75 mg/dL (ref 0.40–1.20)
GFR: 84.81 mL/min (ref >60.00)
GLUCOSE: 105 mg/dL — AB (ref 70–99)
POTASSIUM: 4.9 meq/L (ref 3.5–5.1)
SODIUM: 141 meq/L (ref 135–145)
TOTAL PROTEIN: 7 g/dL (ref 6.0–8.3)

## 2017-07-18 LAB — T4, FREE: Free T4: 1.09 ng/dL (ref 0.60–1.60)

## 2017-07-18 LAB — TSH: TSH: 0.98 u[IU]/mL (ref 0.35–4.50)

## 2017-07-18 NOTE — Assessment & Plan Note (Signed)
Euthyroid on current dose of synthroid, clinically. Check labs today.

## 2017-07-18 NOTE — Assessment & Plan Note (Signed)
Due for labs today. No changes made to rxs. 

## 2017-07-18 NOTE — Patient Instructions (Signed)
Great to see you. Happy Holidays!  We will call you with your results from today and you can view them online. 

## 2017-07-18 NOTE — Assessment & Plan Note (Addendum)
Reviewed preventive care protocols, scheduled due services, and updated immunizations Discussed nutrition, exercise, diet, and healthy lifestyle.  Discussed USPSTF recommendations of cervical cancer screening.  She is aware that interval of 3 years is recommended but pt would prefer to have pap smear done today.  

## 2017-07-18 NOTE — Progress Notes (Signed)
Subjective:   Patient ID: Kelly Wyatt, female    DOB: 08/06/1961, 56 y.o.   MRN: [id number]  Kelly Wyatt is a pleasant 56 y.o. year old female who presents to clinic today with Annual Exam (Patient is here today for a CPE with PAP.  She is not currently fasting but will be in town next week and can schedule a lab visit if labs need to be fasting to be comleted.  Mammogram is scheduled for next week.)  on 07/18/2017  HPI:  56 yo female with complicated medical history, including cervical somatic dysfunction here for CPX and follow up of chronic medical conditions.   Health Maintenance  Topic Date Due  . PNEUMOCOCCAL POLYSACCHARIDE VACCINE (1) 01/16/1963  . FOOT EXAM  01/16/1971  . OPHTHALMOLOGY EXAM  01/16/1971  . URINE MICROALBUMIN  01/16/1971  . HIV Screening  01/16/1976  . HEMOGLOBIN A1C  05/20/2014  . MAMMOGRAM  07/25/2017 (Originally 01/25/2016)  . PAP SMEAR  02/16/2019  . COLONOSCOPY  02/27/2021  . TETANUS/TDAP  01/07/2023  . INFLUENZA VACCINE  Completed  . Hepatitis C Screening  Completed  '  Mammogram  Scheduled for next wee Last colonoscopy 02/28/11 S/p hysterectomy but does have cervix- last pap smear 02/16/16 (done by me). No h/o abnormal pap smears.  IBS- still having issues with constipation.  Still taking Miralax with linzess.  Hypothyroidism-  synthroid 125 mcg daily.   Due for labs.    HLD- on Niaspan, due for labs. Lab Results  Component Value Date   CHOL 294 (H) 02/16/2016   HDL 109.30 02/16/2016   LDLCALC 170 (H) 02/16/2016   LDLDIRECT 200.3 07/16/2012   TRIG 73.0 02/16/2016   CHOLHDL 3 02/16/2016     Lab Results  Component Value Date   CREATININE 0.72 02/16/2016   Lab Results  Component Value Date   TSH 3.86 12/12/2016   Lab Results  Component Value Date   WBC 5.7 02/16/2016   HGB 12.0 02/16/2016   HCT 36.0 02/16/2016   MCV 93.5 02/16/2016   PLT 250.0 02/16/2016   Lab Results  Component Value Date   NA 141 02/16/2016   K  4.1 02/16/2016   CL 101 02/16/2016   CO2 29 02/16/2016   Lab Results  Component Value Date   HGBA1C 6.2 11/18/2013    Patient Active Problem List   Diagnosis Date Noted  . Well woman exam with routine gynecological exam 02/16/2016  . Fatigue 01/26/2015  . Encounter for routine gynecological examination 12/10/2013  . IBS (irritable bowel syndrome) 10/01/2012  . Cervical (neck) region somatic dysfunction 03/27/2010  . HYPERCHOLESTEROLEMIA 04/27/2008  . Hypothyroidism 01/02/2007  . 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 INTERNAL HEMORRHOIDS 09/06/2004  . DIVERTICULOSIS, COLON 09/06/2004  . 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 Diverticulosis of colon 09/06/2004    Social History   Tobacco Use  . Smoking status: Former Games developer  . Smokeless tobacco: Never Used  Substance Use Topics  . Alcohol use: No  . Drug use: No   Family History  Problem Relation Age of Onset  . Factor V Leiden deficiency Mother    No Known Allergies Current Outpatient Medications on File Prior to Visit  Medication Sig Dispense Refill  . albuterol (PROVENTIL HFA;VENTOLIN HFA) 108 (90 BASE) MCG/ACT inhaler Inhale 2 puffs into the lungs every 6 (six) hours as needed. 1 Inhaler 0  . baclofen (LIORESAL) 10 MG tablet Take 40 to 60 mg's by  mouth daily (Patient taking differently: 30 mg 3 (three) times daily. ) 90 each 1  . diazepam (VALIUM) 2 MG tablet Take 5 mg by mouth daily.    . DULoxetine (CYMBALTA) 30 MG capsule Take 30 mg by mouth daily.    . DULoxetine (CYMBALTA) 60 MG capsule Take 60 mg by mouth daily.    Marland Kitchen. gabapentin (NEURONTIN) 100 MG capsule Take 300 mg by mouth daily as needed.    Marland Kitchen. levothyroxine (SYNTHROID, LEVOTHROID) 125 MCG tablet Take 1 tablet (125 mcg total) by mouth daily. 90 tablet 0  . linaclotide (LINZESS) 290 MCG CAPS capsule Take 1 capsule (290 mcg total) by mouth daily. 90 capsule 0  . niacin (NIASPAN) 500 MG CR tablet Take 2 tablets (1,000 mg  total) by mouth at bedtime. 180 tablet 2  . oxyCODONE (ROXICODONE) 15 MG immediate release tablet Take 15 mg by mouth every 4 (four) hours as needed for pain.    Marland Kitchen. AIMOVIG 140 DOSE 70 MG/ML SOAJ Inject 1 Units into the muscle every 30 (thirty) days.     No current facility-administered medications on file prior to visit.    The PMH, PSH, Social History, Family History, Medications, and allergies [facility] been [facility], and have been updated if relevant.    Review of Systems  Constitutional: Positive for fatigue.  HENT: Negative.   Eyes: Negative.   Respiratory: Negative.   Cardiovascular: Negative.   Gastrointestinal: Negative for abdominal distention, abdominal pain, anal bleeding, blood in stool, constipation, diarrhea, nausea, rectal pain and vomiting.  Endocrine: Negative.   Genitourinary: Negative.   Musculoskeletal: Positive for back pain, neck pain and neck stiffness.  Skin: Negative.   Allergic/Immunologic: Negative.   Neurological: Negative.   Hematological: Negative.   All other systems reviewed and are negative.      Objective:    BP 110/78 (BP Location: Left Arm, Patient Position: Sitting, Cuff Size: Normal)   Pulse 86   Temp 98.2 F (36.8 C) (Oral)   Ht 5\' 6"  (1.676 m)   Wt 158 lb (71.7 kg)   SpO2 97%   BMI 25.50 kg/m   Wt Readings from Last 3 Encounters:  07/18/17 158 lb (71.7 kg)  02/16/16 165 lb (74.8 kg)  02/22/15 172 lb (78 kg)    Physical Exam   General:  Well-developed,well-nourished,in no acute distress; alert,appropriate and cooperative throughout examination Head:  normocephalic and atraumatic.   Eyes:  vision grossly intact, PERRL Ears:  R ear normal and L ear normal externally, TMs clear bilaterally Nose:  no external deformity.   Mouth:  good dentition.   Neck:  No deformities, masses, or tenderness noted. Breasts:  No mass, nodules, thickening, tenderness, bulging, retraction, inflamation, nipple discharge or skin changes noted.     Lungs:  Normal respiratory effort, chest expands symmetrically. Lungs are clear to auscultation, no crackles or wheezes. Heart:  Normal rate and regular rhythm. S1 and S2 normal without gallop, murmur, click, rub or other extra sounds. Abdomen:  Bowel sounds positive,abdomen soft and non-tender without masses, organomegaly or hernias noted. Rectal:  no external abnormalities.   Genitalia:  Pelvic Exam:        External: normal female genitalia without lesions or masses        Vagina: normal without lesions or masses        Cervix: normal without lesions or masses        Adnexa: normal bimanual exam without masses or fullness  Uterus: normal by palpation        Pap smear: performed Msk:  No deformity or scoliosis noted of thoracic or lumbar spine.   Extremities:  No clubbing, cyanosis, edema, or deformity noted with normal full range of motion of all joints.   Neurologic:  alert & oriented X3 and gait normal.   Skin:  Intact without suspicious lesions or rashes Cervical Nodes:  No lymphadenopathy noted Axillary Nodes:  No palpable lymphadenopathy Psych:  Cognition and judgment appear intact. Alert and cooperative with normal attention span and concentration. No apparent delusions, illusions, hallucinations         Assessment & Plan:   Well woman exam - Plan: CBC with Differential/Platelet  Well woman exam with routine gynecological exam - Plan: Cytology - PAP  Toxic diffuse goiter - Plan: TSH, T4, free  Other specified hypothyroidism  HYPERCHOLESTEROLEMIA - Plan: Comprehensive metabolic panel, Lipid panel  Cervical (neck) region somatic dysfunction No Follow-up on file.

## 2017-07-19 ENCOUNTER — Encounter: Payer: Self-pay | Admitting: Family Medicine

## 2017-07-19 ENCOUNTER — Other Ambulatory Visit: Payer: Self-pay

## 2017-07-19 LAB — CYTOLOGY - PAP
DIAGNOSIS: NEGATIVE
HPV: NOT DETECTED

## 2017-07-19 LAB — HIV ANTIBODY (ROUTINE TESTING W REFLEX): HIV: NONREACTIVE

## 2017-07-19 MED ORDER — LEVOTHYROXINE SODIUM 125 MCG PO TABS: 125.0000 ug | ORAL_TABLET | Freq: Every day | ORAL | 3 refills | Status: DC

## 2017-07-19 MED ORDER — LINACLOTIDE 290 MCG PO CAPS: 290.0000 ug | ORAL_CAPSULE | Freq: Every day | ORAL | 3 refills | Status: DC

## 2017-07-19 NOTE — Progress Notes (Signed)
Sent refills/thx dmf

## 2017-07-29 ENCOUNTER — Telehealth: Payer: Self-pay

## 2017-07-29 NOTE — Telephone Encounter (Signed)
PA for Linzess initiated/[employer] form filled out and faxed to plan/thx dmf

## 2017-08-20 ENCOUNTER — Encounter: Payer: Self-pay | Admitting: Family Medicine

## 2017-08-21 ENCOUNTER — Encounter: Payer: Self-pay | Admitting: Family Medicine

## 2017-09-10 ENCOUNTER — Other Ambulatory Visit: Payer: Self-pay

## 2017-09-10 MED ORDER — NIACIN ER (ANTIHYPERLIPIDEMIC) 500 MG PO TBCR: 1000.0000 mg | EXTENDED_RELEASE_TABLET | Freq: Every day | ORAL | 2 refills | Status: DC

## 2017-09-12 ENCOUNTER — Other Ambulatory Visit: Payer: Self-pay

## 2017-09-12 ENCOUNTER — Encounter: Payer: Self-pay | Admitting: Family Medicine

## 2017-09-12 MED ORDER — LINACLOTIDE 290 MCG PO CAPS: 290.0000 ug | ORAL_CAPSULE | Freq: Every day | ORAL | 3 refills | Status: DC

## 2017-09-12 MED ORDER — LEVOTHYROXINE SODIUM 125 MCG PO TABS: 125.0000 ug | ORAL_TABLET | Freq: Every day | ORAL | 3 refills | Status: DC

## 2018-07-31 ENCOUNTER — Encounter: Payer: Commercial Managed Care - PPO | Admitting: Family Medicine

## 2018-11-03 ENCOUNTER — Telehealth: Payer: Self-pay

## 2018-11-03 NOTE — Telephone Encounter (Signed)
Copied from CRM [id number]. Topic: General - Inquiry >> Nov 03, 2018 10:28 AM Lynne Logan D wrote: Reason for CRM: Pt called to see if Dr. Dayton Martes advises she go ahead a reschedule her physical from December or if she should wait due to Covid-19. Please advise pt.

## 2018-11-03 NOTE — Telephone Encounter (Signed)
Sent pt MyChart message/thx dmf 

## 2018-11-04 ENCOUNTER — Ambulatory Visit (INDEPENDENT_AMBULATORY_CARE_PROVIDER_SITE_OTHER): Payer: Commercial Managed Care - PPO | Admitting: Family Medicine

## 2018-11-04 DIAGNOSIS — E038 Other specified hypothyroidism: Secondary | ICD-10-CM

## 2018-11-04 DIAGNOSIS — K581 Irritable bowel syndrome with constipation: Secondary | ICD-10-CM

## 2018-11-04 MED ORDER — LINACLOTIDE 290 MCG PO CAPS: 290.0000 ug | ORAL_CAPSULE | Freq: Every day | ORAL | 3 refills | Status: DC

## 2018-11-04 MED ORDER — LEVOTHYROXINE SODIUM 125 MCG PO TABS: 125.0000 ug | ORAL_TABLET | Freq: Every day | ORAL | 3 refills | Status: DC

## 2018-11-04 NOTE — Progress Notes (Signed)
Virtual Visit via Video   I connected with Kelly Wyatt on 11/04/18 at 12:00 PM EDT by a video enabled telemedicine application and verified that I am speaking with the correct person using two identifiers. Location patient: Home Location provider: [facility], Office Persons participating in the virtual visit: Mady Gemma, MD   I discussed the limitations of evaluation and management by telemedicine and the availability of in person appointments. The patient expressed understanding and agreed to proceed.  Subjective:   HPI:   she is needing requesting refill of Synthroid DAW (Sent). She wants to discuss Linzess.  Hypthyroidism-  Currently taking synthroid 125 mcg daily.  Has not changed dosage in years. Denies any symptoms of hypo or hyperthyroidism. Lab Results  Component Value Date   TSH 0.98 07/18/2017   Linzess-  Had colonoscopy done in 07/2018 and it was normal.  She is using miralax and other rxs given by GI.    ROS: See pertinent positives and negatives per HPI.  Review of Systems  Constitutional: Negative.   HENT: Negative.   Eyes: Negative.   Respiratory: Negative.   Cardiovascular: Negative.   Gastrointestinal: Positive for constipation.  Endocrine: Negative.   Genitourinary: Negative.   Allergic/Immunologic: Negative.   Neurological: Negative.   Hematological: Negative.      Patient Active Problem List   Diagnosis Date Noted  . Well woman exam with routine gynecological exam 02/16/2016  . Fatigue 01/26/2015  . Encounter for routine gynecological examination 12/10/2013  . IBS (irritable bowel syndrome) 10/01/2012  . Cervical (neck) region somatic dysfunction 03/27/2010  . HYPERCHOLESTEROLEMIA 04/27/2008  . Hypothyroidism 01/02/2007  . 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 INTERNAL HEMORRHOIDS 09/06/2004  . DIVERTICULOSIS, COLON 09/06/2004  . 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 Smoking status:  Former Games developer  . Smokeless tobacco: Never Used  Substance Use Topics  . Alcohol use: No    Current Outpatient Medications:  .  AIMOVIG 140 DOSE 70 MG/ML SOAJ, Inject 1 Units into the muscle every 30 (thirty) days., Disp: , Rfl:  .  diazepam (VALIUM) 2 MG tablet, Take 5 mg by mouth daily., Disp: , Rfl:  .  DULoxetine (CYMBALTA) 60 MG capsule, Take 60 mg by mouth daily., Disp: , Rfl:  .  gabapentin (NEURONTIN) 100 MG capsule, Take 300 mg by mouth daily as needed., Disp: , Rfl:  .  levothyroxine (SYNTHROID, LEVOTHROID) 125 MCG tablet, Take 1 tablet (125 mcg total) by mouth daily., Disp: 90 tablet, Rfl: 3 .  linaclotide (LINZESS) 290 MCG CAPS capsule, Take 1 capsule (290 mcg total) by mouth daily., Disp: 90 capsule, Rfl: 3 .  oxyCODONE (ROXICODONE) 15 MG immediate release tablet, Take 15 mg by mouth every 4 (four) hours as needed for pain., Disp: , Rfl:  .  tiZANidine (ZANAFLEX) 4 MG tablet, Take 4 mg by mouth 3 (three) times daily., Disp: , Rfl:  .  DULoxetine (CYMBALTA) 30 MG capsule, Take 30 mg by mouth daily., Disp: , Rfl:   No Known Allergies  Objective:  There were no vitals taken for this visit. VITALS: Per patient if applicable, see vitals. GENERAL: Alert, appears well and in no acute distress. HEENT: Atraumatic, conjunctiva clear, no obvious abnormalities on inspection of external nose and ears. NECK: Normal movements of the head and neck. CARDIOPULMONARY: No increased WOB. Speaking in clear sentences. I:E ratio WNL.  MS: Moves all visible extremities without noticeable abnormality. PSYCH: Pleasant and cooperative,  well-groomed. Speech normal rate and rhythm. Affect is appropriate. Insight and judgement are appropriate. Attention is focused, linear, and appropriate.  NEURO: CN grossly intact. Oriented as arrived to appointment on time with no prompting. Moves both UE equally.  SKIN: No obvious lesions, wounds, erythema, or cyanosis noted on face or hands.  Assessment and Plan:    There are no diagnoses linked to this encounter.  . Reviewed expectations re: course of current medical issues. . Discussed self-management of symptoms. . Outlined signs and symptoms indicating need for more acute intervention. . Patient verbalized understanding and all questions were answered. Marland Kitchen Health Maintenance issues including appropriate healthy diet, exercise, and smoking avoidance were discussed with patient. . See orders for this visit as documented in the electronic medical record.  Ruthe Mannan, MD 11/04/2018

## 2018-11-04 NOTE — Assessment & Plan Note (Signed)
Due for labs but given her multiple comorbities and that her synthroid dosage has not changed, will refill current dosage (she is clinically euthyroid) and she will schedule a physical in 3 months.

## 2018-11-04 NOTE — Assessment & Plan Note (Signed)
Refilled Linzess.  Advised checking with GI before starting to take.

## 2019-02-03 ENCOUNTER — Telehealth: Payer: Self-pay | Admitting: Family Medicine

## 2019-02-03 NOTE — Telephone Encounter (Addendum)
Yes we can even see her next week for labs and physicals.

## 2019-02-03 NOTE — Telephone Encounter (Signed)
Pt called and wanted to schedule a CPE and labs, because of limited options I had to schedule her for CPE 04/01/2019 but pt wants to see if she can get labs done before then, is that ok? She wants to get it done early July

## 2019-02-04 ENCOUNTER — Telehealth: Payer: Self-pay | Admitting: Family Medicine

## 2019-02-04 DIAGNOSIS — E7800 Pure hypercholesterolemia, unspecified: Secondary | ICD-10-CM

## 2019-02-04 DIAGNOSIS — Z01419 Encounter for gynecological examination (general) (routine) without abnormal findings: Secondary | ICD-10-CM

## 2019-02-04 DIAGNOSIS — E038 Other specified hypothyroidism: Secondary | ICD-10-CM

## 2019-02-04 NOTE — Telephone Encounter (Signed)
Patient called wanting labs done before appt. Is it possible to that the patient could have some labs put in before visit. Please advise.

## 2019-02-04 NOTE — Telephone Encounter (Signed)
Yes okay for her to come for labs prior to appt.

## 2019-02-04 NOTE — Telephone Encounter (Signed)
Okay for labs before appt.  

## 2019-02-04 NOTE — Telephone Encounter (Signed)
Tried to call, left message telling her to call back and ask for me

## 2019-02-13 NOTE — Telephone Encounter (Signed)
I called and left message on patient voicemail that labs can be done prior to appointment and lab orders has be placed in system. Patient informed to call and schedule lab appointment.

## 2019-02-13 NOTE — Telephone Encounter (Signed)
Labs have been entered, okay for lab appt before physical.

## 2019-03-31 ENCOUNTER — Telehealth: Payer: Self-pay

## 2019-03-31 ENCOUNTER — Encounter: Payer: Self-pay | Admitting: Family Medicine

## 2019-03-31 NOTE — Progress Notes (Signed)
Subjective:   Patient ID: Kelly Wyatt, female    DOB: 06-07-1961, 58 y.o.   MRN: [id number]  Kelly Wyatt is a pleasant 58 y.o. year old female who presents to clinic today with Annual Exam (CPE- fasting/ wants flu shot/ mammogram scheduled )  on 04/01/2019  HPI:  58 yo female with complicated medical history, including cervical somatic dysfunction here for CPX and follow up of chronic medical conditions.   Health Maintenance  Topic Date Due  . MAMMOGRAM  07/23/2018  . INFLUENZA VACCINE  03/07/2019  . PAP SMEAR-Modifier  07/18/2020  . COLONOSCOPY  02/27/2021  . TETANUS/TDAP  01/07/2023  . Hepatitis C Screening  Completed  . HIV Screening  Completed  '  Mammogram is scheduled. Last colonoscopy 07/2018 S/p hysterectomy but does have cervix- last pap smear 07/18/17 (done by me). No h/o abnormal pap smears.  IBS- still having issues with constipation.  Still taking Miralax and Relxor.  Hypothyroidism-  synthroid 125 mcg daily.   Due for labs.  Lab Results  Component Value Date   TSH 0.34 (L) 04/01/2019   DM- Lab Results  Component Value Date   HGBA1C 6.3 04/01/2019     Chronic pain syndrome/ cervical neck region with somatic dysfunction- had last block don eon 10/14/18- note reviewed.  HLD- on Niaspan, due for labs. Lab Results  Component Value Date   CHOL 322 (H) 04/01/2019   HDL 82.80 04/01/2019   LDLCALC 219 (H) 04/01/2019   LDLDIRECT 200.3 07/16/2012   TRIG 104.0 04/01/2019   CHOLHDL 4 04/01/2019     Lab Results  Component Value Date   CREATININE 0.74 04/01/2019   Lab Results  Component Value Date   TSH 0.34 (L) 04/01/2019   Lab Results  Component Value Date   WBC 5.7 04/01/2019   HGB 12.6 04/01/2019   HCT 38.2 04/01/2019   MCV 93.1 04/01/2019   PLT 257.0 04/01/2019   Lab Results  Component Value Date   NA 138 04/01/2019   K 4.2 04/01/2019   CL 100 04/01/2019   CO2 29 04/01/2019   Lab Results  Component Value Date   HGBA1C 6.3  04/01/2019    Patient Active Problem List   Diagnosis Date Noted  . Fatigue 01/26/2015  . Well woman exam without gynecological exam 12/10/2013  . IBS (irritable bowel syndrome) 10/01/2012  . Cervical (neck) region somatic dysfunction 03/27/2010  . HYPERCHOLESTEROLEMIA 04/27/2008  . Hypothyroidism 01/02/2007  . 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 INTERNAL HEMORRHOIDS 09/06/2004  . DIVERTICULOSIS, COLON 09/06/2004  . 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 Diverticulosis of colon 09/06/2004    Social History   Tobacco Use  . Smoking status: Former Research scientist (life sciences)  . Smokeless tobacco: Never Used  Substance Use Topics  . Alcohol use: No  . Drug use: No   Family History  Problem Relation Age of Onset  . Factor V Leiden deficiency Mother    No Known Allergies Current Outpatient Medications on File Prior to Visit  Medication Sig Dispense Refill  . AIMOVIG 140 DOSE 70 MG/ML SOAJ Inject 1 Units into the muscle every 30 (thirty) days.    . diazepam (VALIUM) 2 MG tablet Take 5 mg by mouth daily.    . DULoxetine (CYMBALTA) 30 MG capsule Take 30 mg by mouth daily.    . DULoxetine (CYMBALTA) 60 MG capsule Take 60 mg by mouth daily.    Marland Kitchen gabapentin (NEURONTIN) 100 MG capsule Take  300 mg by mouth daily as needed.    Marland Kitchen. levothyroxine (SYNTHROID, LEVOTHROID) 125 MCG tablet Take 1 tablet (125 mcg total) by mouth daily. 90 tablet 3  . oxyCODONE (ROXICODONE) 15 MG immediate release tablet Take 15 mg by mouth every 4 (four) hours as needed for pain.    Marland Kitchen. tiZANidine (ZANAFLEX) 4 MG tablet Take 4 mg by mouth 3 (three) times daily.    . Methylnaltrexone Bromide (RELISTOR) 150 MG TABS Take 1 tablet by mouth daily.     No current facility-administered medications on file prior to visit.    The PMH, PSH, Social History, Family History, Medications, and allergies [facility], and have been updated if relevant.    Review of Systems  Constitutional:  Positive for fatigue.  HENT: Negative.   Eyes: Negative.   Respiratory: Negative.   Cardiovascular: Negative.   Gastrointestinal: Negative for abdominal distention, abdominal pain, anal bleeding, blood in stool, constipation, diarrhea, nausea, rectal pain and vomiting.  Endocrine: Negative.   Genitourinary: Negative.   Musculoskeletal: Positive for back pain, neck pain and neck stiffness.  Skin: Negative.   Allergic/Immunologic: Negative.   Neurological: Negative.   Hematological: Negative.   All other systems reviewed and are negative.      Objective:    BP 118/84   Pulse 77   Temp 97.7 F (36.5 C) (Oral)   Ht 5\' 6"  (1.676 m)   Wt 161 lb (73 kg)   SpO2 97%   BMI 25.99 kg/m   Wt Readings from Last 3 Encounters:  04/01/19 161 lb (73 kg)  07/18/17 158 lb (71.7 kg)  02/16/16 165 lb (74.8 kg)    Physical Exam    General:  Well-developed,well-nourished,in no acute distress; alert,appropriate and cooperative throughout examination Head:  normocephalic and atraumatic.   Eyes:  vision grossly intact, PERRL Ears:  R ear normal and L ear normal externally, TMs clear bilaterally Nose:  no external deformity.   Mouth:  good dentition.   Neck:  No deformities, masses, or tenderness noted. Breasts:  No mass, nodules, thickening, tenderness, bulging, retraction, inflamation, nipple discharge or skin changes noted.   Lungs:  Normal respiratory effort, chest expands symmetrically. Lungs are clear to auscultation, no crackles or wheezes. Heart:  Normal rate and regular rhythm. S1 and S2 normal without gallop, murmur, click, rub or other extra sounds. Abdomen:  Bowel sounds positive,abdomen soft and non-tender without masses, organomegaly or hernias noted. Msk:  No deformity or scoliosis noted of thoracic or lumbar spine.   Extremities:  No clubbing, cyanosis, edema, or deformity noted with normal full range of motion of all joints.   Neurologic:  alert & oriented X3 and gait normal.    Skin:  Intact without suspicious lesions or rashes Cervical Nodes:  No lymphadenopathy noted Axillary Nodes:  No palpable lymphadenopathy Psych:  Cognition and judgment appear intact. Alert and cooperative with normal attention span and concentration. No apparent delusions, illusions, hallucinations          Assessment & Plan:   Well woman exam without gynecological exam  Other specified hypothyroidism - Plan: TSH, T4, free  HYPERCHOLESTEROLEMIA - Plan: Lipid panel, Comprehensive metabolic panel, CBC with Differential/Platelet, Hemoglobin A1c  Cervical (neck) region somatic dysfunction  Irritable bowel syndrome with constipation  Other fatigue - Plan: Vitamin D (25 hydroxy), B12  Need for influenza vaccination - Plan: Flu Vaccine QUAD 6+ mos PF IM (Fluarix Quad PF) No follow-ups on file.

## 2019-03-31 NOTE — Telephone Encounter (Signed)
Questions for Screening COVID-19  Symptom onset: n/a  Travel or Contacts: no  During this illness, did/does the patient experience any of the following symptoms? Fever >100.5F []   Yes [x]   No []   Unknown Subjective fever (felt feverish) []   Yes [x]   No []   Unknown Chills []   Yes [x]   No []   Unknown Muscle aches (myalgia) []   Yes [x]   No []   Unknown Runny nose (rhinorrhea) []   Yes [x]   No []   Unknown Sore throat []   Yes [x]   No []   Unknown Cough (new onset or worsening of chronic cough) []   Yes [x]   No []   Unknown Shortness of breath (dyspnea) []   Yes [x]   No []   Unknown Nausea or vomiting []   Yes [x]   No []   Unknown Headache []   Yes [x]   No []   Unknown Abdominal pain  []   Yes [x]   No []   Unknown Diarrhea (?3 loose/looser than normal stools/24hr period) []   Yes [x]   No []   Unknown Other, specify:  Patient risk factors: Smoker? []   Current []   Former []   Never If female, currently pregnant? []   Yes []   No  Patient Active Problem List   Diagnosis Date Noted  . Fatigue 01/26/2015  . Well woman exam with routine gynecological exam 12/10/2013  . IBS (irritable bowel syndrome) 10/01/2012  . Cervical (neck) region somatic dysfunction 03/27/2010  . HYPERCHOLESTEROLEMIA 04/27/2008  . Hypothyroidism 01/02/2007  . 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 INTERNAL HEMORRHOIDS 09/06/2004  . DIVERTICULOSIS, COLON 09/06/2004  . 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 Call ahead.  []   High risk for COVID-19 but stable. Inform provider and coordinate time for [facility] visit.   []   No red flags but URI signs or symptoms okay for [facility] visit.

## 2019-04-01 ENCOUNTER — Encounter: Payer: Self-pay | Admitting: Family Medicine

## 2019-04-01 ENCOUNTER — Other Ambulatory Visit: Payer: Self-pay

## 2019-04-01 ENCOUNTER — Ambulatory Visit (INDEPENDENT_AMBULATORY_CARE_PROVIDER_SITE_OTHER): Payer: Commercial Managed Care - PPO | Admitting: Family Medicine

## 2019-04-01 VITALS — BP 118/84 | HR 77 | Temp 97.7°F | Ht 66.0 in | Wt 161.0 lb

## 2019-04-01 DIAGNOSIS — R5383 Other fatigue: Secondary | ICD-10-CM | POA: Diagnosis not present

## 2019-04-01 DIAGNOSIS — Z23 Encounter for immunization: Secondary | ICD-10-CM

## 2019-04-01 DIAGNOSIS — E7800 Pure hypercholesterolemia, unspecified: Secondary | ICD-10-CM | POA: Diagnosis not present

## 2019-04-01 DIAGNOSIS — M9901 Segmental and somatic dysfunction of cervical region: Secondary | ICD-10-CM

## 2019-04-01 DIAGNOSIS — E038 Other specified hypothyroidism: Secondary | ICD-10-CM | POA: Diagnosis not present

## 2019-04-01 DIAGNOSIS — Z0000 Encounter for general adult medical examination without abnormal findings: Secondary | ICD-10-CM

## 2019-04-01 DIAGNOSIS — K581 Irritable bowel syndrome with constipation: Secondary | ICD-10-CM

## 2019-04-01 LAB — CBC WITH DIFFERENTIAL/PLATELET
Basophils Absolute: 0 10*3/uL (ref 0.0–0.1)
Basophils Relative: 0.7 % (ref 0.0–3.0)
Eosinophils Absolute: 0.1 10*3/uL (ref 0.0–0.7)
Eosinophils Relative: 2.4 % (ref 0.0–5.0)
HCT: 38.2 % (ref 36.0–46.0)
Hemoglobin: 12.6 g/dL (ref 12.0–15.0)
Lymphocytes Relative: 36.8 % (ref 12.0–46.0)
Lymphs Abs: 2.1 10*3/uL (ref 0.7–4.0)
MCHC: 33.1 g/dL (ref 30.0–36.0)
MCV: 93.1 fl (ref 78.0–100.0)
Monocytes Absolute: 0.5 10*3/uL (ref 0.1–1.0)
Monocytes Relative: 8.8 % (ref 3.0–12.0)
Neutro Abs: 2.9 10*3/uL (ref 1.4–7.7)
Neutrophils Relative %: 51.3 % (ref 43.0–77.0)
Platelets: 257 10*3/uL (ref 150.0–400.0)
RBC: 4.1 Mil/uL (ref 3.87–5.11)
RDW: 13.8 % (ref 11.5–15.5)
WBC: 5.7 10*3/uL (ref 4.0–10.5)

## 2019-04-01 LAB — COMPREHENSIVE METABOLIC PANEL
ALT: 19 U/L (ref 0–35)
AST: 20 U/L (ref 0–37)
Albumin: 4.5 g/dL (ref 3.5–5.2)
Alkaline Phosphatase: 82 U/L (ref 39–117)
BUN: 16 mg/dL (ref 6–23)
CO2: 29 mEq/L (ref 19–32)
Calcium: 9.3 mg/dL (ref 8.4–10.5)
Chloride: 100 mEq/L (ref 96–112)
Creatinine, Ser: 0.74 mg/dL (ref 0.40–1.20)
GFR: 80.55 mL/min (ref >60.00)
Glucose, Bld: 117 mg/dL — ABNORMAL HIGH (ref 70–99)
Potassium: 4.2 mEq/L (ref 3.5–5.1)
Sodium: 138 mEq/L (ref 135–145)
Total Bilirubin: 0.7 mg/dL (ref 0.2–1.2)
Total Protein: 7.2 g/dL (ref 6.0–8.3)

## 2019-04-01 LAB — LIPID PANEL
Cholesterol: 322 mg/dL — ABNORMAL HIGH (ref 0–200)
HDL: 82.8 mg/dL (ref >39.00)
LDL Cholesterol: 219 mg/dL — ABNORMAL HIGH (ref 0–99)
NonHDL: 239.67
Total CHOL/HDL Ratio: 4
Triglycerides: 104 mg/dL (ref 0.0–149.0)
VLDL: 20.8 mg/dL (ref 0.0–40.0)

## 2019-04-01 LAB — TSH: TSH: 0.34 u[IU]/mL — ABNORMAL LOW (ref 0.35–4.50)

## 2019-04-01 LAB — T4, FREE: Free T4: 1.38 ng/dL (ref 0.60–1.60)

## 2019-04-01 LAB — VITAMIN D 25 HYDROXY (VIT D DEFICIENCY, FRACTURES): VITD: 23.34 ng/mL — ABNORMAL LOW (ref 30.00–100.00)

## 2019-04-01 LAB — VITAMIN B12: Vitamin B-12: 280 pg/mL (ref 211–911)

## 2019-04-01 LAB — HEMOGLOBIN A1C: Hgb A1c MFr Bld: 6.3 % (ref 4.6–6.5)

## 2019-04-01 MED ORDER — CHOLECALCIFEROL 1.25 MG (50000 UT) PO TABS: 1.0000 | ORAL_TABLET | ORAL | 0 refills | Status: DC

## 2019-04-01 NOTE — Assessment & Plan Note (Signed)
Due for labs today. Orders Placed This Encounter  Procedures  . Flu Vaccine QUAD 6+ mos PF IM (Fluarix Quad PF)  . Vitamin D (25 hydroxy)  . TSH  . T4, free  . Lipid panel  . Comprehensive metabolic panel  . CBC with Differential/Platelet  . B12  . Hemoglobin A1c

## 2019-04-01 NOTE — Assessment & Plan Note (Signed)
On synthroid 125 mcg daily.  Due for labs. Orders Placed This Encounter  Procedures  . Flu Vaccine QUAD 6+ mos PF IM (Fluarix Quad PF)  . Vitamin D (25 hydroxy)  . TSH  . T4, free  . Lipid panel  . Comprehensive metabolic panel  . CBC with Differential/Platelet  . B12  . Hemoglobin A1c

## 2019-04-01 NOTE — Assessment & Plan Note (Signed)
Additional labs ordered today.

## 2019-04-01 NOTE — Assessment & Plan Note (Signed)
Followed by Dr. Winfred Burn. Getting injections every 4 weeks.

## 2019-04-01 NOTE — Patient Instructions (Signed)
Great to see you. I will call you with your lab results from today and you can view them online.   

## 2019-04-01 NOTE — Assessment & Plan Note (Addendum)
Reviewed preventive care protocols, scheduled due services, and updated immunizations Discussed nutrition, exercise, diet, and healthy lifestyle.  Influenza vaccine given 

## 2019-04-02 ENCOUNTER — Other Ambulatory Visit: Payer: Self-pay | Admitting: Family Medicine

## 2019-04-02 MED ORDER — LEVOTHYROXINE SODIUM 112 MCG PO TABS: 112.0000 ug | ORAL_TABLET | Freq: Every day | ORAL | 3 refills | Status: DC

## 2019-04-05 ENCOUNTER — Other Ambulatory Visit: Payer: Self-pay | Admitting: Family Medicine

## 2019-04-05 MED ORDER — EZETIMIBE 10 MG PO TABS: 10.0000 mg | ORAL_TABLET | Freq: Every day | ORAL | 3 refills | Status: DC

## 2019-04-15 ENCOUNTER — Other Ambulatory Visit: Payer: Self-pay | Admitting: Family Medicine

## 2019-04-15 DIAGNOSIS — E038 Other specified hypothyroidism: Secondary | ICD-10-CM

## 2019-04-15 DIAGNOSIS — E7800 Pure hypercholesterolemia, unspecified: Secondary | ICD-10-CM

## 2019-04-15 DIAGNOSIS — E559 Vitamin D deficiency, unspecified: Secondary | ICD-10-CM

## 2019-06-09 ENCOUNTER — Other Ambulatory Visit: Payer: Commercial Managed Care - PPO

## 2019-09-10 NOTE — Progress Notes (Signed)
Virtual Visit via Video   Due to the COVID-19 pandemic, this visit was completed with telemedicine (audio/video) technology to reduce patient and provider exposure as well as to preserve personal protective equipment.   I connected with Kelly Wyatt by a video enabled telemedicine application and verified that I am speaking with the correct person using two identifiers. Location patient: Home Location provider: [facility], Office Persons participating in the virtual visit: Mady Gemma, MD   I discussed the limitations of evaluation and management by telemedicine and the availability of in person appointments. The patient expressed understanding and agreed to proceed.  Interactive audio and video telecommunications were attempted between this provider and patient, however failed, due to patient having technical difficulties OR patient did not have access to video capability.  We continued and completed visit with audio only.   Care Team   Patient Care Team: Dianne Dun, MD as PCP - General  Subjective:   HPI: Patient is connecting today to follow-up. She is scheduled for fasting labs in the morning. After these return will advise [place] if she needs refills on Zetia and Synthroid (DAW).    Review of Systems  Constitutional: Negative for fever and malaise/fatigue.  HENT: Negative for congestion and hearing loss.   Eyes: Negative for blurred vision, discharge and redness.  Respiratory: Negative for cough and shortness of breath.   Cardiovascular: Negative for chest pain, palpitations and leg swelling.  Gastrointestinal: Positive for constipation. Negative for abdominal pain, blood in stool, diarrhea, heartburn, melena, nausea and vomiting.  Genitourinary: Negative for dysuria.  Musculoskeletal: Negative for falls.  Skin: Negative for rash.  Neurological: Negative for loss of consciousness and headaches.  Endo/Heme/Allergies: Does not bruise/bleed easily.    Psychiatric/Behavioral: Negative for depression.  All other systems reviewed and are negative.    Patient Active Problem List   Diagnosis Date Noted  . Diet-controlled diabetes mellitus (HCC) 09/13/2019  . Chronic pain syndrome 09/13/2019  . Vitamin D deficiency 09/13/2019  . Fatigue 01/26/2015  . IBS (irritable bowel syndrome) 10/01/2012  . Cervical (neck) region somatic dysfunction 03/27/2010  . HYPERCHOLESTEROLEMIA 04/27/2008  . Hypothyroidism 01/02/2007  . 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 INTERNAL HEMORRHOIDS 09/06/2004  . DIVERTICULOSIS, COLON 09/06/2004  . 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 Smoking status: Former Games developer  . Smokeless tobacco: Never Used  Substance Use Topics  . Alcohol use: No    Current Outpatient Medications:  .  AIMOVIG 140 DOSE 70 MG/ML SOAJ, Inject 1 Units into the muscle every 30 (thirty) days., Disp: , Rfl:  .  diazepam (VALIUM) 5 MG tablet, Take 5 mg by mouth 3 (three) times daily as needed., Disp: , Rfl:  .  DULoxetine (CYMBALTA) 30 MG capsule, Take 30 mg by mouth daily., Disp: , Rfl:  .  DULoxetine (CYMBALTA) 60 MG capsule, Take 60 mg by mouth daily., Disp: , Rfl:  .  ezetimibe (ZETIA) 10 MG tablet, Take 1 tablet (10 mg total) by mouth daily., Disp: 90 tablet, Rfl: 3 .  levothyroxine (SYNTHROID) 112 MCG tablet, Take 1 tablet (112 mcg total) by mouth daily., Disp: 90 tablet, Rfl: 3 .  Methylnaltrexone Bromide (RELISTOR) 150 MG TABS, Take 1 tablet by mouth daily., Disp: , Rfl:  .  NURTEC 75 MG TBDP, Take 1 tablet by mouth daily as needed., Disp: , Rfl:  .  oxyCODONE (ROXICODONE) 15 MG immediate release tablet, Take 15 mg by mouth every  4 (four) hours as needed for pain., Disp: , Rfl:  .  tiZANidine (ZANAFLEX) 4 MG tablet, Take 4 mg by mouth 3 (three) times daily., Disp: , Rfl:  .  Cholecalciferol 1.25 MG (50000 UT) TABS, Take 1 tablet by mouth once a week. (Patient not taking: Reported on 09/14/2019), Disp: 12  tablet, Rfl: 0  No Known Allergies  Objective:   VITALS: Per patient if applicable, see vitals. GENERAL: Alert, appears well and in no acute distress. HEENT: Atraumatic, conjunctiva clear, no obvious abnormalities on inspection of external nose and ears. NECK: Normal movements of the head and neck. CARDIOPULMONARY: No increased WOB. Speaking in clear sentences. I:E ratio WNL.  MS: Moves all visible extremities without noticeable abnormality. PSYCH: Pleasant and cooperative, well-groomed. Speech normal rate and rhythm. Affect is appropriate. Insight and judgement are appropriate. Attention is focused, linear, and appropriate.  NEURO: CN grossly intact. Oriented as arrived to appointment on time with no prompting. Moves both UE equally.  SKIN: No obvious lesions, wounds, erythema, or cyanosis noted on face or hands.  Depression screen [facility] 2/9 11/04/2018 07/18/2017 02/16/2016  Decreased Interest 0 0 0  Down, Depressed, Hopeless 0 0 0  PHQ - 2 Score 0 0 0     . COVID-19 Education: The signs and symptoms of COVID-19 were discussed with the patient and how to seek care for testing if needed. The importance of social distancing was discussed today. . Reviewed expectations re: course of current medical issues. . Discussed self-management of symptoms. . Outlined signs and symptoms indicating need for more acute intervention. . Patient verbalized understanding and all questions were answered. Marland Kitchen Health Maintenance issues including appropriate healthy diet, exercise, and smoking avoidance were discussed with patient. . See orders for this visit as documented in the electronic medical record.  Ruthe Mannan, MD  Records requested if needed. Time spent: 25 minutes, of which >50% was spent in obtaining information about her symptoms, reviewing her previous labs, evaluations, and treatments, counseling her about her condition (please see the discussed topics above), and developing a plan to further  investigate it; she had a number of questions which I addressed.   Lab Results  Component Value Date   WBC 5.7 04/01/2019   HGB 12.6 04/01/2019   HCT 38.2 04/01/2019   PLT 257.0 04/01/2019   GLUCOSE 117 (H) 04/01/2019   CHOL 322 (H) 04/01/2019   TRIG 104.0 04/01/2019   HDL 82.80 04/01/2019   LDLDIRECT 200.3 07/16/2012   LDLCALC 219 (H) 04/01/2019   ALT 19 04/01/2019   AST 20 04/01/2019   NA 138 04/01/2019   K 4.2 04/01/2019   CL 100 04/01/2019   CREATININE 0.74 04/01/2019   BUN 16 04/01/2019   CO2 29 04/01/2019   TSH 0.34 (L) 04/01/2019   HGBA1C 6.3 04/01/2019    Lab Results  Component Value Date   TSH 0.34 (L) 04/01/2019   Lab Results  Component Value Date   WBC 5.7 04/01/2019   HGB 12.6 04/01/2019   HCT 38.2 04/01/2019   MCV 93.1 04/01/2019   PLT 257.0 04/01/2019   Lab Results  Component Value Date   NA 138 04/01/2019   K 4.2 04/01/2019   CO2 29 04/01/2019   GLUCOSE 117 (H) 04/01/2019   BUN 16 04/01/2019   CREATININE 0.74 04/01/2019   BILITOT 0.7 04/01/2019   ALKPHOS 82 04/01/2019   AST 20 04/01/2019   ALT 19 04/01/2019   PROT 7.2 04/01/2019   ALBUMIN 4.5 04/01/2019  CALCIUM 9.3 04/01/2019   GFR 80.55 04/01/2019   Lab Results  Component Value Date   CHOL 322 (H) 04/01/2019   Lab Results  Component Value Date   HDL 82.80 04/01/2019   Lab Results  Component Value Date   LDLCALC 219 (H) 04/01/2019   Lab Results  Component Value Date   TRIG 104.0 04/01/2019   Lab Results  Component Value Date   CHOLHDL 4 04/01/2019   Lab Results  Component Value Date   HGBA1C 6.3 04/01/2019       Assessment & Plan:   Problem List Items Addressed This Visit      Active Problems   Hypothyroidism - Primary    History: Compliant with synthroid 125 mcg daily.     Current symptoms: change in energy level. Patient denies heat / cold intolerance, nervousness and palpitations. Symptoms have been well-controlled. Lab Results  Component Value Date    TSH 0.34 (L) 04/01/2019   TSH 0.98 07/18/2017   TSH 3.86 12/12/2016   Lab Results  Component Value Date   FREET4 1.38 04/01/2019   FREET4 1.09 07/18/2017   FREET4 1.09 12/12/2016    Assessment/Plan: . Recheck thyroid labs today.   Instructed not to take MVM or iron within 4 hours of taking thyroid medications.  Common Reasons for Abnormal TSH Levels on a Previously Stable Dosage of Thyroid Hormone . Patient nonadherent to thyroid hormone regimen (missing doses) . Decreased absorption of thyroid hormone . Patient is now taking thyroid hormone with food . Patient takes thyroid hormone within four hours of calcium, iron, soy products, or aluminum-containing antacids . Patient is prescribed medication that decreases absorption of thyroid hormone, such as cholestyramine (Questran), colestipol (Colestid), orlistat (Xenical), or sucralfate (Carafate) . Patient is now pregnant or recently started or stopped estrogen-containing oral contraceptive or hormone therapy . Generic substitution for brand name or vice versa, or substitution of one generic formulation for another . Patient started on sertraline (Zoloft), another selective serotonin reuptake inhibitor, or a tricyclic antidepressant . Patient started on carbamazepine (Tegretol) or phenytoin (Dilantin)          Relevant Orders   CBC with Differential/Platelet   TSH   T3   T4, free   HYPERCHOLESTEROLEMIA    Has not been controlled with niaspan. Added Zetia last August, patient has no side effects from medication and LFTS are normal. No changes today.  Due for labs.  Lab Results  Component Value Date   CHOL 322 (H) 04/01/2019   HDL 82.80 04/01/2019   LDLCALC 219 (H) 04/01/2019   LDLDIRECT 200.3 07/16/2012   TRIG 104.0 04/01/2019   CHOLHDL 4 04/01/2019   The ASCVD Risk score Mikey Bussing DC Jr., et al., 2013) failed to calculate for the following reasons:   The valid total cholesterol range is 130 to 320 mg/dL       Relevant  Orders   CBC with Differential/Platelet   Comprehensive metabolic panel   Lipid panel   Cervical (neck) region somatic dysfunction    With chronic pain syndrome. Last received trigger point injection on 08/18/19- note reviewed.      IBS (irritable bowel syndrome)    She is still having intermittent issues with constipation.  Still taking Miralax and Relxor.      Fatigue   Relevant Orders   CBC with Differential/Platelet   B12   Diet-controlled diabetes mellitus ([place])    Has been diet controlled. diabetic diet compliance: compliant most of the time, further diabetic ROS: no  polyuria or polydipsia, no chest pain, dyspnea or TIA's, no numbness, tingling or pain in extremities. Lab Results  Component Value Date   HGBA1C 6.3 04/01/2019   HGBA1C 6.2 11/18/2013   HGBA1C 6.2 07/16/2012   Lab Results  Component Value Date   LDLCALC 219 (H) 04/01/2019   CREATININE 0.74 04/01/2019    Health Maintenance 1.  Patient is counseled on appropriate foot care. 2.  BP goal < 130/80. 3.  LDL goal of < 100, HDL > 40 and TG < 150. All diabetic should be on a statin unless contraindication. 4.  Eye Exam yearly and Dental Exam every 6 months. 5.  Dietary recommendations: < 100 g carbohydrates daily. 6.  Physical Activity recommendations: As tolerated aerobic and strength exercises.  7.  Appropriate vaccines reviewed.  8.  Due for labs today.       Relevant Orders   CBC with Differential/Platelet   Comprehensive metabolic panel   Lipid panel   Hemoglobin A1c   Chronic pain syndrome    ?cymbalta and Aiovig (?migraines)?      Vitamin D deficiency    History: Lab Results  Component Value Date   VD25OH 23.34 (L) 04/01/2019   VD25OH 30.80 02/16/2016   VD25OH 33.01 01/26/2015     Assessment/Plan:       Goal vitamin D level > 40. Plan: Vitamin D supplementation. Due for labs today.         Relevant Orders   Vitamin D (25 hydroxy)      I have discontinued Rashana R. Wheeler's  gabapentin. I am also having her maintain her DULoxetine, DULoxetine, oxyCODONE, Aimovig (140 MG Dose), tiZANidine, Methylnaltrexone Bromide, Cholecalciferol, levothyroxine, ezetimibe, diazepam, and Nurtec.  No orders of the defined types were placed in this encounter.    Ruthe Mannan, MD

## 2019-09-13 DIAGNOSIS — E119 Type 2 diabetes mellitus without complications: Secondary | ICD-10-CM | POA: Insufficient documentation

## 2019-09-13 DIAGNOSIS — G894 Chronic pain syndrome: Secondary | ICD-10-CM | POA: Insufficient documentation

## 2019-09-13 DIAGNOSIS — E559 Vitamin D deficiency, unspecified: Secondary | ICD-10-CM | POA: Insufficient documentation

## 2019-09-13 NOTE — Assessment & Plan Note (Addendum)
Has not been controlled with niaspan. Added Zetia last August, patient has no side effects from medication and LFTS are normal. No changes today.  Due for labs.  Lab Results  Component Value Date   CHOL 322 (H) 04/01/2019   HDL 82.80 04/01/2019   LDLCALC 219 (H) 04/01/2019   LDLDIRECT 200.3 07/16/2012   TRIG 104.0 04/01/2019   CHOLHDL 4 04/01/2019   The ASCVD Risk score Denman George DC Jr., et al., 2013) failed to calculate for the following reasons:   The valid total cholesterol range is 130 to 320 mg/dL

## 2019-09-13 NOTE — Assessment & Plan Note (Signed)
She is still having intermittent issues with constipation.  Still taking Miralax and Relxor.

## 2019-09-13 NOTE — Assessment & Plan Note (Signed)
With chronic pain syndrome. Last received trigger point injection on 08/18/19- note reviewed.

## 2019-09-13 NOTE — Assessment & Plan Note (Signed)
History: Compliant with synthroid 125 mcg daily.     Current symptoms: change in energy level. Patient denies heat / cold intolerance, nervousness and palpitations. Symptoms have been well-controlled. Lab Results  Component Value Date   TSH 0.34 (L) 04/01/2019   TSH 0.98 07/18/2017   TSH 3.86 12/12/2016   Lab Results  Component Value Date   FREET4 1.38 04/01/2019   FREET4 1.09 07/18/2017   FREET4 1.09 12/12/2016    Assessment/Plan: . Recheck thyroid labs today.   Instructed not to take MVM or iron within 4 hours of taking thyroid medications.  Common Reasons for Abnormal TSH Levels on a Previously Stable Dosage of Thyroid Hormone . Patient nonadherent to thyroid hormone regimen (missing doses) . Decreased absorption of thyroid hormone . Patient is now taking thyroid hormone with food . Patient takes thyroid hormone within four hours of calcium, iron, soy products, or aluminum-containing antacids . Patient is prescribed medication that decreases absorption of thyroid hormone, such as cholestyramine (Questran), colestipol (Colestid), orlistat (Xenical), or sucralfate (Carafate) . Patient is now pregnant or recently started or stopped estrogen-containing oral contraceptive or hormone therapy . Generic substitution for brand name or vice versa, or substitution of one generic formulation for another . Patient started on sertraline (Zoloft), another selective serotonin reuptake inhibitor, or a tricyclic antidepressant . Patient started on carbamazepine (Tegretol) or phenytoin (Dilantin)

## 2019-09-13 NOTE — Assessment & Plan Note (Signed)
History: Lab Results  Component Value Date   VD25OH 23.34 (L) 04/01/2019   VD25OH 30.80 02/16/2016   VD25OH 33.01 01/26/2015     Assessment/Plan:       Goal vitamin D level > 40. Plan: Vitamin D supplementation. Due for labs today.

## 2019-09-13 NOTE — Assessment & Plan Note (Signed)
?  cymbalta and Aiovig (?migraines)?

## 2019-09-13 NOTE — Assessment & Plan Note (Signed)
Has been diet controlled. diabetic diet compliance: compliant most of the time, further diabetic ROS: no polyuria or polydipsia, no chest pain, dyspnea or TIA's, no numbness, tingling or pain in extremities. Lab Results  Component Value Date   HGBA1C 6.3 04/01/2019   HGBA1C 6.2 11/18/2013   HGBA1C 6.2 07/16/2012   Lab Results  Component Value Date   LDLCALC 219 (H) 04/01/2019   CREATININE 0.74 04/01/2019    Health Maintenance 1.  Patient is counseled on appropriate foot care. 2.  BP goal < 130/80. 3.  LDL goal of < 100, HDL > 40 and TG < 150. All diabetic should be on a statin unless contraindication. 4.  Eye Exam yearly and Dental Exam every 6 months. 5.  Dietary recommendations: < 100 g carbohydrates daily. 6.  Physical Activity recommendations: As tolerated aerobic and strength exercises.  7.  Appropriate vaccines reviewed.  8.  Due for labs today.

## 2019-09-14 ENCOUNTER — Other Ambulatory Visit: Payer: Self-pay

## 2019-09-14 ENCOUNTER — Encounter: Payer: Self-pay | Admitting: Family Medicine

## 2019-09-14 ENCOUNTER — Telehealth (INDEPENDENT_AMBULATORY_CARE_PROVIDER_SITE_OTHER): Payer: Commercial Managed Care - PPO | Admitting: Family Medicine

## 2019-09-14 DIAGNOSIS — K581 Irritable bowel syndrome with constipation: Secondary | ICD-10-CM

## 2019-09-14 DIAGNOSIS — E7800 Pure hypercholesterolemia, unspecified: Secondary | ICD-10-CM

## 2019-09-14 DIAGNOSIS — M9901 Segmental and somatic dysfunction of cervical region: Secondary | ICD-10-CM

## 2019-09-14 DIAGNOSIS — E038 Other specified hypothyroidism: Secondary | ICD-10-CM | POA: Diagnosis not present

## 2019-09-14 DIAGNOSIS — E119 Type 2 diabetes mellitus without complications: Secondary | ICD-10-CM | POA: Diagnosis not present

## 2019-09-14 DIAGNOSIS — G894 Chronic pain syndrome: Secondary | ICD-10-CM

## 2019-09-14 DIAGNOSIS — R5383 Other fatigue: Secondary | ICD-10-CM

## 2019-09-14 DIAGNOSIS — E559 Vitamin D deficiency, unspecified: Secondary | ICD-10-CM

## 2019-09-15 ENCOUNTER — Other Ambulatory Visit (INDEPENDENT_AMBULATORY_CARE_PROVIDER_SITE_OTHER): Payer: Commercial Managed Care - PPO

## 2019-09-15 ENCOUNTER — Telehealth: Payer: Self-pay | Admitting: Family Medicine

## 2019-09-15 DIAGNOSIS — E559 Vitamin D deficiency, unspecified: Secondary | ICD-10-CM | POA: Diagnosis not present

## 2019-09-15 DIAGNOSIS — E7800 Pure hypercholesterolemia, unspecified: Secondary | ICD-10-CM | POA: Diagnosis not present

## 2019-09-15 DIAGNOSIS — E119 Type 2 diabetes mellitus without complications: Secondary | ICD-10-CM

## 2019-09-15 DIAGNOSIS — E038 Other specified hypothyroidism: Secondary | ICD-10-CM

## 2019-09-15 DIAGNOSIS — R5383 Other fatigue: Secondary | ICD-10-CM | POA: Diagnosis not present

## 2019-09-15 LAB — COMPREHENSIVE METABOLIC PANEL
ALT: 23 U/L (ref 0–35)
AST: 19 U/L (ref 0–37)
Albumin: 4.4 g/dL (ref 3.5–5.2)
Alkaline Phosphatase: 79 U/L (ref 39–117)
BUN: 15 mg/dL (ref 6–23)
CO2: 34 mEq/L — ABNORMAL HIGH (ref 19–32)
Calcium: 9.5 mg/dL (ref 8.4–10.5)
Chloride: 100 mEq/L (ref 96–112)
Creatinine, Ser: 0.68 mg/dL (ref 0.40–1.20)
GFR: 88.66 mL/min (ref >60.00)
Glucose, Bld: 99 mg/dL (ref 70–99)
Potassium: 3.7 mEq/L (ref 3.5–5.1)
Sodium: 139 mEq/L (ref 135–145)
Total Bilirubin: 0.4 mg/dL (ref 0.2–1.2)
Total Protein: 7 g/dL (ref 6.0–8.3)

## 2019-09-15 LAB — CBC WITH DIFFERENTIAL/PLATELET
Basophils Absolute: 0 10*3/uL (ref 0.0–0.1)
Basophils Relative: 0.7 % (ref 0.0–3.0)
Eosinophils Absolute: 0.1 10*3/uL (ref 0.0–0.7)
Eosinophils Relative: 1.6 % (ref 0.0–5.0)
HCT: 37.3 % (ref 36.0–46.0)
Hemoglobin: 12.2 g/dL (ref 12.0–15.0)
Lymphocytes Relative: 37.9 % (ref 12.0–46.0)
Lymphs Abs: 2.5 10*3/uL (ref 0.7–4.0)
MCHC: 32.7 g/dL (ref 30.0–36.0)
MCV: 96.2 fl (ref 78.0–100.0)
Monocytes Absolute: 0.5 10*3/uL (ref 0.1–1.0)
Monocytes Relative: 7.6 % (ref 3.0–12.0)
Neutro Abs: 3.5 10*3/uL (ref 1.4–7.7)
Neutrophils Relative %: 52.2 % (ref 43.0–77.0)
Platelets: 269 10*3/uL (ref 150.0–400.0)
RBC: 3.88 Mil/uL (ref 3.87–5.11)
RDW: 14.9 % (ref 11.5–15.5)
WBC: 6.7 10*3/uL (ref 4.0–10.5)

## 2019-09-15 LAB — TSH: TSH: 1.84 u[IU]/mL (ref 0.35–4.50)

## 2019-09-15 LAB — LIPID PANEL
Cholesterol: 227 mg/dL — ABNORMAL HIGH (ref 0–200)
HDL: 83 mg/dL (ref >39.00)
LDL Cholesterol: 119 mg/dL — ABNORMAL HIGH (ref 0–99)
NonHDL: 143.71
Total CHOL/HDL Ratio: 3
Triglycerides: 123 mg/dL (ref 0.0–149.0)
VLDL: 24.6 mg/dL (ref 0.0–40.0)

## 2019-09-15 LAB — VITAMIN D 25 HYDROXY (VIT D DEFICIENCY, FRACTURES): VITD: 30.17 ng/mL (ref 30.00–100.00)

## 2019-09-15 LAB — T4, FREE: Free T4: 0.87 ng/dL (ref 0.60–1.60)

## 2019-09-15 LAB — HEMOGLOBIN A1C: Hgb A1c MFr Bld: 6.3 % (ref 4.6–6.5)

## 2019-09-15 LAB — VITAMIN B12: Vitamin B-12: 340 pg/mL (ref 211–911)

## 2019-09-15 NOTE — Telephone Encounter (Signed)
Patient is calling and requesting a call back regarding labs and medication. CB is [phone]

## 2019-09-16 LAB — T3: T3, Total: 50 ng/dL — ABNORMAL LOW (ref 76–181)

## 2019-09-16 MED ORDER — LEVOTHYROXINE SODIUM 112 MCG PO TABS: 112.0000 ug | ORAL_TABLET | Freq: Every day | ORAL | 3 refills | Status: AC

## 2019-09-16 MED ORDER — EZETIMIBE 10 MG PO TABS: 10.0000 mg | ORAL_TABLET | Freq: Every day | ORAL | 3 refills | Status: AC

## 2019-09-16 MED ORDER — CHOLECALCIFEROL 1.25 MG (50000 UT) PO TABS: 1.0000 | ORAL_TABLET | ORAL | 0 refills | Status: DC

## 2019-09-16 NOTE — Telephone Encounter (Signed)
Discussed lab results with pt/thx dmf

## 2019-12-11 ENCOUNTER — Other Ambulatory Visit: Payer: Self-pay

## 2019-12-11 MED ORDER — CHOLECALCIFEROL 1.25 MG (50000 UT) PO TABS: 1.0000 | ORAL_TABLET | ORAL | 0 refills | Status: AC

## 2020-01-19 ENCOUNTER — Telehealth: Payer: Self-pay | Admitting: General Practice

## 2020-01-19 NOTE — Telephone Encounter (Signed)
Patient is calling and requesting a call back from [place]. CB is [phone]

## 2020-01-26 ENCOUNTER — Telehealth: Payer: Self-pay | Admitting: Family Medicine

## 2020-01-26 NOTE — Telephone Encounter (Signed)
Tried to call to see what pt wants to do since Dr Aron is no longer her, lvm for pt to call back 

## 2020-03-15 ENCOUNTER — Other Ambulatory Visit: Payer: Self-pay | Admitting: Family Medicine

## 2020-11-03 LAB — HM MAMMOGRAPHY

## 2020-12-22 ENCOUNTER — Ambulatory Visit: Payer: Commercial Managed Care - PPO | Admitting: Nurse Practitioner
